# Patient Record
Sex: Male | Born: 1980
Health system: Southern US, Community
[De-identification: ages and names within clinical notes are randomized; demographics above are authoritative.]

## PROBLEM LIST (undated history)

## (undated) DIAGNOSIS — I10 Essential (primary) hypertension: Secondary | ICD-10-CM

## (undated) HISTORY — PX: OTHER SURGICAL HISTORY: SHX169

---

## 2008-11-19 ENCOUNTER — Emergency Department: Payer: Self-pay | Admitting: Unknown Physician Specialty

## 2008-11-30 ENCOUNTER — Emergency Department: Payer: Self-pay | Admitting: Unknown Physician Specialty

## 2014-07-20 ENCOUNTER — Emergency Department: Payer: Self-pay | Admitting: Emergency Medicine

## 2014-07-20 LAB — CBC
HCT: 45.1 % (ref 40.0–52.0)
HGB: 15 g/dL (ref 13.0–18.0)
MCH: 30 pg (ref 26.0–34.0)
MCHC: 33.3 g/dL (ref 32.0–36.0)
MCV: 90 fL (ref 80–100)
Platelet: 197 10*3/uL (ref 150–440)
RBC: 5.01 10*6/uL (ref 4.40–5.90)
RDW: 14.6 % — ABNORMAL HIGH (ref 11.5–14.5)
WBC: 15.5 10*3/uL — ABNORMAL HIGH (ref 3.8–10.6)

## 2014-07-20 LAB — COMPREHENSIVE METABOLIC PANEL
ALT: 62 U/L
Albumin: 3.6 g/dL (ref 3.4–5.0)
Alkaline Phosphatase: 72 U/L
Anion Gap: 8 (ref 7–16)
BUN: 13 mg/dL (ref 7–18)
Bilirubin,Total: 0.4 mg/dL (ref 0.2–1.0)
CO2: 25 mmol/L (ref 21–32)
CREATININE: 0.76 mg/dL (ref 0.60–1.30)
Calcium, Total: 8.1 mg/dL — ABNORMAL LOW (ref 8.5–10.1)
Chloride: 106 mmol/L (ref 98–107)
EGFR (African American): >60
EGFR (Non-African Amer.): >60
Glucose: 94 mg/dL (ref 65–99)
Osmolality: 277 (ref 275–301)
Potassium: 4.5 mmol/L (ref 3.5–5.1)
SGOT(AST): 60 U/L — ABNORMAL HIGH (ref 15–37)
SODIUM: 139 mmol/L (ref 136–145)
TOTAL PROTEIN: 7.7 g/dL (ref 6.4–8.2)

## 2014-07-20 LAB — ETHANOL: Ethanol: <3 mg/dL

## 2015-02-21 ENCOUNTER — Inpatient Hospital Stay
Admission: EM | Admit: 2015-02-21 | Discharge: 2015-02-22 | DRG: 193 | Disposition: A | Discharge status: Home / Self Care | Payer: 59 | Attending: Internal Medicine | Admitting: Internal Medicine

## 2015-02-21 ENCOUNTER — Emergency Department: Payer: 59

## 2015-02-21 DIAGNOSIS — R0602 Shortness of breath: Secondary | ICD-10-CM

## 2015-02-21 DIAGNOSIS — F17200 Nicotine dependence, unspecified, uncomplicated: Secondary | ICD-10-CM | POA: Diagnosis present

## 2015-02-21 DIAGNOSIS — I10 Essential (primary) hypertension: Secondary | ICD-10-CM | POA: Diagnosis present

## 2015-02-21 DIAGNOSIS — R739 Hyperglycemia, unspecified: Secondary | ICD-10-CM | POA: Diagnosis present

## 2015-02-21 DIAGNOSIS — J9601 Acute respiratory failure with hypoxia: Secondary | ICD-10-CM | POA: Diagnosis present

## 2015-02-21 DIAGNOSIS — R0902 Hypoxemia: Secondary | ICD-10-CM

## 2015-02-21 DIAGNOSIS — J189 Pneumonia, unspecified organism: Secondary | ICD-10-CM | POA: Diagnosis present

## 2015-02-21 DIAGNOSIS — J9600 Acute respiratory failure, unspecified whether with hypoxia or hypercapnia: Secondary | ICD-10-CM

## 2015-02-21 LAB — COMPREHENSIVE METABOLIC PANEL
ALK PHOS: 83 U/L (ref 38–126)
ALT: 33 U/L (ref 17–63)
AST: 22 U/L (ref 15–41)
Albumin: 4.2 g/dL (ref 3.5–5.0)
Anion gap: 8 (ref 5–15)
BUN: 16 mg/dL (ref 6–20)
CHLORIDE: 100 mmol/L — AB (ref 101–111)
CO2: 27 mmol/L (ref 22–32)
CREATININE: 0.8 mg/dL (ref 0.61–1.24)
Calcium: 9.2 mg/dL (ref 8.9–10.3)
Glucose, Bld: 144 mg/dL — ABNORMAL HIGH (ref 65–99)
Potassium: 4.1 mmol/L (ref 3.5–5.1)
SODIUM: 135 mmol/L (ref 135–145)
TOTAL PROTEIN: 8.4 g/dL — AB (ref 6.5–8.1)
Total Bilirubin: 0.5 mg/dL (ref 0.3–1.2)

## 2015-02-21 LAB — CBC WITH DIFFERENTIAL/PLATELET
BASOS ABS: 0.2 10*3/uL — AB (ref 0–0.1)
Basophils Relative: 1 %
EOS ABS: 0.1 10*3/uL (ref 0–0.7)
HEMATOCRIT: 47.6 % (ref 40.0–52.0)
HEMOGLOBIN: 15.9 g/dL (ref 13.0–18.0)
Lymphs Abs: 0.8 10*3/uL — ABNORMAL LOW (ref 1.0–3.6)
MCH: 29.1 pg (ref 26.0–34.0)
MCHC: 33.3 g/dL (ref 32.0–36.0)
MCV: 87.5 fL (ref 80.0–100.0)
MONO ABS: 0.6 10*3/uL (ref 0.2–1.0)
Monocytes Relative: 5 %
NEUTROS ABS: 10.7 10*3/uL — AB (ref 1.4–6.5)
Platelets: 167 10*3/uL (ref 150–440)
RBC: 5.44 MIL/uL (ref 4.40–5.90)
RDW: 14.2 % (ref 11.5–14.5)
WBC: 12.3 10*3/uL — ABNORMAL HIGH (ref 3.8–10.6)

## 2015-02-21 LAB — BRAIN NATRIURETIC PEPTIDE: B Natriuretic Peptide: 16 pg/mL (ref 0.0–100.0)

## 2015-02-21 LAB — TROPONIN I: Troponin I: <0.03 ng/mL (ref <0.031)

## 2015-02-21 MED ORDER — PNEUMOCOCCAL VAC POLYVALENT 25 MCG/0.5ML IJ INJ
0.5000 mL | INJECTION | INTRAMUSCULAR | Status: AC
  Administered 2015-02-22: 0.5 mL via INTRAMUSCULAR
  Filled 2015-02-21: qty 0.5

## 2015-02-21 MED ORDER — CEFTRIAXONE SODIUM IN DEXTROSE 20 MG/ML IV SOLN
INTRAVENOUS | Status: AC
  Administered 2015-02-21: 1 g via INTRAVENOUS
  Filled 2015-02-21: qty 50

## 2015-02-21 MED ORDER — HYDROCODONE-ACETAMINOPHEN 5-325 MG PO TABS
1.0000 | ORAL_TABLET | ORAL | Status: DC | PRN
  Administered 2015-02-22 (×2): 1 via ORAL
  Filled 2015-02-21 (×2): qty 1

## 2015-02-21 MED ORDER — CEFTRIAXONE SODIUM IN DEXTROSE 20 MG/ML IV SOLN
1.0000 g | Freq: Once | INTRAVENOUS | Status: AC
  Administered 2015-02-21: 1 g via INTRAVENOUS

## 2015-02-21 MED ORDER — INSULIN ASPART 100 UNIT/ML ~~LOC~~ SOLN
0.0000 [IU] | Freq: Three times a day (TID) | SUBCUTANEOUS | Status: DC
  Administered 2015-02-22: 5 [IU] via SUBCUTANEOUS
  Filled 2015-02-21: qty 5

## 2015-02-21 MED ORDER — IPRATROPIUM-ALBUTEROL 0.5-2.5 (3) MG/3ML IN SOLN
3.0000 mL | Freq: Four times a day (QID) | RESPIRATORY_TRACT | Status: DC
  Administered 2015-02-21 – 2015-02-22 (×2): 3 mL via RESPIRATORY_TRACT
  Filled 2015-02-21 (×3): qty 3

## 2015-02-21 MED ORDER — IPRATROPIUM-ALBUTEROL 0.5-2.5 (3) MG/3ML IN SOLN
3.0000 mL | Freq: Once | RESPIRATORY_TRACT | Status: AC
  Administered 2015-02-21: 3 mL via RESPIRATORY_TRACT

## 2015-02-21 MED ORDER — METHYLPREDNISOLONE SODIUM SUCC 125 MG IJ SOLR
60.0000 mg | Freq: Four times a day (QID) | INTRAMUSCULAR | Status: DC
  Administered 2015-02-21 – 2015-02-22 (×4): 60 mg via INTRAVENOUS
  Filled 2015-02-21 (×2): qty 2

## 2015-02-21 MED ORDER — DEXTROSE 5 % IV SOLN
INTRAVENOUS | Status: AC
  Administered 2015-02-21: 500 mg via INTRAVENOUS
  Filled 2015-02-21: qty 500

## 2015-02-21 MED ORDER — IPRATROPIUM-ALBUTEROL 0.5-2.5 (3) MG/3ML IN SOLN
RESPIRATORY_TRACT | Status: AC
  Administered 2015-02-21: 3 mL via RESPIRATORY_TRACT
  Filled 2015-02-21: qty 3

## 2015-02-21 MED ORDER — SODIUM CHLORIDE 0.9 % IV SOLN
INTRAVENOUS | Status: DC
  Administered 2015-02-21 – 2015-02-22 (×2): via INTRAVENOUS

## 2015-02-21 MED ORDER — METOPROLOL TARTRATE 25 MG PO TABS
25.0000 mg | ORAL_TABLET | Freq: Two times a day (BID) | ORAL | Status: DC
  Administered 2015-02-22: 25 mg via ORAL
  Filled 2015-02-21: qty 1

## 2015-02-21 MED ORDER — ONDANSETRON HCL 4 MG PO TABS: 4.0000 mg | ORAL_TABLET | Freq: Four times a day (QID) | ORAL | Status: DC | PRN

## 2015-02-21 MED ORDER — CEFTRIAXONE SODIUM IN DEXTROSE 20 MG/ML IV SOLN
1.0000 g | Freq: Two times a day (BID) | INTRAVENOUS | Status: DC
  Administered 2015-02-22: 1 g via INTRAVENOUS
  Filled 2015-02-21 (×3): qty 50

## 2015-02-21 MED ORDER — METHYLPREDNISOLONE SODIUM SUCC 125 MG IJ SOLR
INTRAMUSCULAR | Status: AC
  Administered 2015-02-22: 60 mg via INTRAVENOUS
  Filled 2015-02-21: qty 2

## 2015-02-21 MED ORDER — IPRATROPIUM-ALBUTEROL 0.5-2.5 (3) MG/3ML IN SOLN
RESPIRATORY_TRACT | Status: AC
  Filled 2015-02-21: qty 3

## 2015-02-21 MED ORDER — ONDANSETRON HCL 4 MG/2ML IJ SOLN: 4.0000 mg | Freq: Four times a day (QID) | INTRAMUSCULAR | Status: DC | PRN

## 2015-02-21 MED ORDER — IPRATROPIUM-ALBUTEROL 0.5-2.5 (3) MG/3ML IN SOLN
RESPIRATORY_TRACT | Status: AC
Start: 2015-02-21 — End: 2015-02-22
  Filled 2015-02-21: qty 3

## 2015-02-21 MED ORDER — POLYETHYLENE GLYCOL 3350 17 G PO PACK: 17.0000 g | PACK | Freq: Every day | ORAL | Status: DC | PRN

## 2015-02-21 MED ORDER — DEXAMETHASONE SODIUM PHOSPHATE 10 MG/ML IJ SOLN
10.0000 mg | Freq: Once | INTRAMUSCULAR | Status: AC
  Administered 2015-02-21: 10 mg via INTRAMUSCULAR

## 2015-02-21 MED ORDER — DEXTROSE 5 % IV SOLN
500.0000 mg | INTRAVENOUS | Status: DC
  Administered 2015-02-22: 500 mg via INTRAVENOUS
  Filled 2015-02-21 (×2): qty 500

## 2015-02-21 MED ORDER — HEPARIN SODIUM (PORCINE) 5000 UNIT/ML IJ SOLN
5000.0000 [IU] | Freq: Three times a day (TID) | INTRAMUSCULAR | Status: DC
  Administered 2015-02-22 (×2): 5000 [IU] via SUBCUTANEOUS
  Filled 2015-02-21 (×2): qty 1

## 2015-02-21 MED ORDER — DEXAMETHASONE SODIUM PHOSPHATE 10 MG/ML IJ SOLN
INTRAMUSCULAR | Status: AC
Start: 2015-02-21 — End: 2015-02-21
  Administered 2015-02-21: 10 mg via INTRAMUSCULAR
  Filled 2015-02-21: qty 1

## 2015-02-21 MED ORDER — DEXTROSE 5 % IV SOLN
500.0000 mg | Freq: Once | INTRAVENOUS | Status: AC
  Administered 2015-02-21: 500 mg via INTRAVENOUS

## 2015-02-21 NOTE — ED Provider Notes (Signed)
-----------------------------------------   5:31 PM on 02/21/2015 -----------------------------------------  I have personally seen and examined the patient. Patient is a 34 year old male with no known past medical history who presents the emergency department with 2 days of cough and congestion, much worse at night now short of breath. Shortness of breath worse with exertion. Patient has been coughing with a green/yellow sputum production. He states he smokes approximately one pack per day of cigarettes, but does not typically get short of breath with exertion. Patient denies any chest pain, pressure, but does state tightness and shortness of breath. He denies any fever but states he has been getting sweaty. Physical exam is consistent for tachycardia around 110 and 120 bpm. Also with bilateral rhonchi, which does not clear with coughing. Patient also desats to 86-87% on room air. Patient denies any recent chest pain. Given his physical exam findings and history, the chest x-ray is likely most compatible with an atypical pneumonia causing his cough/congestion and hypoxia. Technically meeting sepsis criteria, along with desaturations blood cultures were taken, we'll start the patient IV antibiotics and admitted the patient to the hospital for further workup and evaluation.  Minna Antis, MD 02/21/15 1733

## 2015-02-21 NOTE — ED Notes (Signed)
Report to Megan, RN.

## 2015-02-21 NOTE — H&P (Signed)
History and Physical    Ronald Arias NWG:[id number] DOB: 01/13/1981 DOA: 02/21/2015  Referring physician: Dr. Lenard Lance PCP: No PCP Per Patient  Specialists: none  Chief Complaint: SOB and cough  HPI: Ronald Arias is a 34 y.o. male has a past medical history significant for tobacco abuse who presents to ER with worsening SOB and cough. In ER, pt hypoxic with ambulation. CXR shows atypical PNA. Now admitted for further evaluation and treatment. Also noted to be hyperglycemic and hypertensive.  Review of Systems: The patient denies anorexia, fever, weight loss,, vision loss, decreased hearing, hoarseness, chest pain, syncope, peripheral edema, balance deficits, hemoptysis, abdominal pain, melena, hematochezia, severe indigestion/heartburn, hematuria, incontinence, genital sores, muscle weakness, suspicious skin lesions, transient blindness, difficulty walking, depression, unusual weight change, abnormal bleeding, enlarged lymph nodes, angioedema, and breast masses.   History reviewed. No pertinent past medical history. History reviewed. No pertinent past surgical history. Social History:  reports that he has been smoking.  He does not have any smokeless tobacco history on file. He reports that he does not drink alcohol. His drug history is not on file.  No Known Allergies  History reviewed. No pertinent family history.  Prior to Admission medications   Not on File   Physical Exam: Filed Vitals:   02/21/15 1352 02/21/15 1630 02/21/15 1700 02/21/15 1800  BP:  149/81 153/80 133/105  Pulse: 107  109 106  Temp:      TempSrc:      Resp:   24 20  Height:      Weight:      SpO2: 95%  88% 90%     General:  No apparent distress  Eyes: PERRL, EOMI, no scleral icterus  ENT: moist oropharynx  Neck: supple, no lymphadenopathy  Cardiovascular: rapid rate, regular rhythm without MRG; 2+ peripheral pulses, no JVD, no peripheral edema  Respiratory: diffuse wheezes and  rhonchi noted bilaterally  Abdomen: soft, non tender to palpation, positive bowel sounds, no guarding, no rebound  Skin: no rashes  Musculoskeletal: normal bulk and tone, no joint swelling  Psychiatric: normal mood and affect  Neurologic: CN 2-12 grossly intact, MS 5/5 in all 4  Labs on Admission:  Basic Metabolic Panel:  Recent Labs Lab 02/21/15 1555  NA 135  K 4.1  CL 100*  CO2 27  GLUCOSE 144*  BUN 16  CREATININE 0.80  CALCIUM 9.2   Liver Function Tests:  Recent Labs Lab 02/21/15 1555  AST 22  ALT 33  ALKPHOS 83  BILITOT 0.5  PROT 8.4*  ALBUMIN 4.2   No results for input(s): LIPASE, AMYLASE in the last 168 hours. No results for input(s): AMMONIA in the last 168 hours. CBC:  Recent Labs Lab 02/21/15 1555  WBC 12.3*  NEUTROABS 10.7*  HGB 15.9  HCT 47.6  MCV 87.5  PLT 167   Cardiac Enzymes:  Recent Labs Lab 02/21/15 1555  TROPONINI <0.03    BNP (last 3 results)  Recent Labs  02/21/15 1555  BNP 16.0    ProBNP (last 3 results) No results for input(s): PROBNP in the last 8760 hours.  CBG: No results for input(s): GLUCAP in the last 168 hours.  Radiological Exams on Admission: Dg Chest 2 View (if Patient Has Fever And/or Copd)  02/21/2015   CLINICAL DATA:  Cough, shortness of breath  EXAM: CHEST  2 VIEW  COMPARISON:  None.  FINDINGS: Bilateral diffuse mild interstitial thickening. Small bilateral pleural effusions, right greater than left. No pneumothorax. Normal cardiomediastinal silhouette. No  acute osseous abnormality.  IMPRESSION: Bilateral, diffuse interstitial thickening and small bilateral pleural effusions. Differential considerations include mild pulmonary edema versus interstitial infection.   Electronically Signed   By: Elige Ko   On: 02/21/2015 15:17    EKG: Independently reviewed.  Assessment/Plan Principal Problem:   Acute respiratory failure Active Problems:   Atypical pneumonia   Hyperglycemia   HTN  (hypertension)  Will admit to floor with IV fluids, IV steroids, IV ABX, and SVN's. Follow O2 closely. Follow sugars and BP. Repeat labs in AM.  Diet: 1800 cal ADA, 2g Na+ Fluids: NS@100  DVT Prophylaxis: SQ Heparin  Code Status: FULL  Family Communication: none  Disposition Plan: home  Time spent: 50 min

## 2015-02-21 NOTE — ED Provider Notes (Signed)
[facility] Emergency Department Provider Note  ____________________________________________  Time seen: Approximately 1445  I have reviewed the triage vital signs and the nursing notes.   HISTORY  Chief Complaint Cough and URI    HPI Ronald Arias is a 34 y.o. male complaining of a worsening cough congestion shortness of breath over the last 3-4 days states that over the last 2 days he has had a productive green cough denies fever or chills states he has no history of asthma or any medical issues of note states that he's been increasingly shortness of breath with exertion can't seem to get his chest clear its overall as about 8 out of 10 in discomfort nothing seemingly making it better and denies any other symptoms of note is denied any lower extremity edema fevers chills states that he is about a 1 pack per day smoker   History reviewed. No pertinent past medical history.  There are no active problems to display for this patient.   History reviewed. No pertinent past surgical history.  No current outpatient prescriptions on file.  Allergies Review of patient's allergies indicates no known allergies.  No family history on file.  Social History History  Substance Use Topics  . Smoking status: Current Every Day Smoker  . Smokeless tobacco: Not on file  . Alcohol Use: No    Review of Systems Constitutional: No fever/chills Eyes: No visual changes. ENT: No sore throat. Cardiovascular: Denies chest pain. Respiratory: Denies shortness of breath. Gastrointestinal: No abdominal pain.  No nausea, no vomiting.  No diarrhea.  No constipation. Genitourinary: Negative for dysuria. Musculoskeletal: Negative for back pain. Skin: Negative for rash. Neurological: Negative for headaches, focal weakness or numbness.  10-point ROS otherwise negative.  ____________________________________________   PHYSICAL EXAM:  VITAL SIGNS: ED Triage Vitals   Enc Vitals Group     BP 02/21/15 1333 178/80 mmHg     Pulse Rate 02/21/15 1333 110     Resp 02/21/15 1333 20     Temp 02/21/15 1333 98.6 F (37 C)     Temp Source 02/21/15 1333 Oral     SpO2 02/21/15 1333 89 %     Weight 02/21/15 1333 260 lb (117.935 kg)     Height 02/21/15 1333  (1.803 m)     Head Cir --      Peak Flow --      Pain Score 02/21/15 1334 0     Pain Loc --      Pain Edu? --      Excl. in GC? --     Constitutional: Alert and oriented. Well appearing and in no acute distress. Eyes: Conjunctivae are normal. PERRL. EOMI. Head: Atraumatic. Nose: No congestion/rhinnorhea. Mouth/Throat: Mucous membranes are moist.  Oropharynx non-erythematous. Neck: No stridor.   Hematological/Lymphatic/Immunilogical: No cervical lymphadenopathy. Cardiovascular: Normal rate, regular rhythm. Grossly normal heart sounds.  Good peripheral circulation. Respiratory: Diminished breath sounds in all fields wheezes and rhonchi bilaterally Gastrointestinal: Soft and nontender. No distention. No abdominal bruits. No CVA tenderness. Musculoskeletal: No lower extremity tenderness nor edema.  No joint effusions. Neurologic:  Normal speech and language. No gross focal neurologic deficits are appreciated. Speech is normal. No gait instability. Skin:  Skin is warm, dry and intact. No rash noted. Psychiatric: Mood and affect are normal. Speech and behavior are normal.  ____________________________________________   LABS pending (all labs ordered are listed, but only abnormal results are displayed)  Labs Reviewed  CULTURE, BLOOD (ROUTINE X 2)  CULTURE, BLOOD (  ROUTINE X 2)  CBC WITH DIFFERENTIAL/PLATELET  BRAIN NATRIURETIC PEPTIDE  TROPONIN I  COMPREHENSIVE METABOLIC PANEL   ____________________________________________  EKG  Pending ____________________________________________  RADIOLOGY  Chest x-ray IMPRESSION: Bilateral, diffuse interstitial thickening and small  bilateral pleural effusions. Differential considerations include mild pulmonary edema versus interstitial infection.   Electronically Signed By: Elige Ko On: 02/21/2015 15:17       ____________________________________________   PROCEDURES  Procedure(s) performed: None  Critical Care performed:   ____________________________________________   INITIAL IMPRESSION / ASSESSMENT AND PLAN / ED COURSE  Pertinent labs & imaging results that were available during my care of the patient were reviewed by me and considered in my medical decision making (see chart for details).  Initial impression on this patient has dyspnea he has received 3 nebulizer treatments in the department as well as a shot of dexamethasone chest x-ray showed him to have bilateral diffuse interstitial thickening with bilateral pleural effusions right greater than left concerning for either pulmonary edema or infection further workup has been ordered patient was moved over to the care of Dr . Lenard Lance please refer to his note for final diagnosis and further information ____________________________________________   FINAL CLINICAL IMPRESSION(S) / ED DIAGNOSES  Final diagnoses:  None     Ashna Dorough Rosalyn Gess, PA-C 02/21/15 1638  Minna Antis, MD 02/21/15 2117

## 2015-02-21 NOTE — ED Notes (Signed)
Pt states that his breathing is better now after duoneb

## 2015-02-21 NOTE — ED Notes (Addendum)
Cough, chest congestion, green production X 2 days. Pt alert and oriented X4, active, cooperative, pt in NAD. RR even and unlabored, color WNL.  Pt states his work requested that he come here to get checked out.

## 2015-02-22 ENCOUNTER — Inpatient Hospital Stay: Payer: 59

## 2015-02-22 ENCOUNTER — Encounter: Payer: Self-pay | Admitting: Internal Medicine

## 2015-02-22 LAB — COMPREHENSIVE METABOLIC PANEL
ALT: 30 U/L (ref 17–63)
AST: 23 U/L (ref 15–41)
Albumin: 3.8 g/dL (ref 3.5–5.0)
Alkaline Phosphatase: 72 U/L (ref 38–126)
Anion gap: 6 (ref 5–15)
BUN: 13 mg/dL (ref 6–20)
CO2: 24 mmol/L (ref 22–32)
CREATININE: 0.85 mg/dL (ref 0.61–1.24)
Calcium: 8.7 mg/dL — ABNORMAL LOW (ref 8.9–10.3)
Chloride: 104 mmol/L (ref 101–111)
GFR calc non Af Amer: >60 mL/min (ref >60)
GLUCOSE: 221 mg/dL — AB (ref 65–99)
Potassium: 4.3 mmol/L (ref 3.5–5.1)
Sodium: 134 mmol/L — ABNORMAL LOW (ref 135–145)
TOTAL PROTEIN: 7.5 g/dL (ref 6.5–8.1)
Total Bilirubin: 0.4 mg/dL (ref 0.3–1.2)

## 2015-02-22 LAB — CBC
HCT: 45.9 % (ref 40.0–52.0)
Hemoglobin: 15.3 g/dL (ref 13.0–18.0)
MCH: 29.4 pg (ref 26.0–34.0)
MCHC: 33.2 g/dL (ref 32.0–36.0)
MCV: 88.4 fL (ref 80.0–100.0)
Platelets: 204 10*3/uL (ref 150–440)
RBC: 5.19 MIL/uL (ref 4.40–5.90)
RDW: 14.3 % (ref 11.5–14.5)
WBC: 10.8 10*3/uL — ABNORMAL HIGH (ref 3.8–10.6)

## 2015-02-22 LAB — HEMOGLOBIN A1C: Hgb A1c MFr Bld: 6 % (ref 4.0–6.0)

## 2015-02-22 LAB — GLUCOSE, CAPILLARY
Glucose-Capillary: 238 mg/dL — ABNORMAL HIGH (ref 65–99)
Glucose-Capillary: 268 mg/dL — ABNORMAL HIGH (ref 65–99)

## 2015-02-22 MED ORDER — METOPROLOL TARTRATE 50 MG PO TABS: 50.0000 mg | ORAL_TABLET | Freq: Two times a day (BID) | ORAL | Status: DC

## 2015-02-22 MED ORDER — METOPROLOL TARTRATE 50 MG PO TABS
50.0000 mg | ORAL_TABLET | Freq: Two times a day (BID) | ORAL | Status: DC
  Administered 2015-02-22: 50 mg via ORAL
  Filled 2015-02-22: qty 1

## 2015-02-22 MED ORDER — ACCU-CHEK MULTICLIX LANCETS MISC: Status: DC

## 2015-02-22 MED ORDER — LEVOFLOXACIN 750 MG PO TABS: 750.0000 mg | ORAL_TABLET | Freq: Every day | ORAL | Status: DC

## 2015-02-22 MED ORDER — METHYLPREDNISOLONE 4 MG PO TABS: 4.0000 mg | ORAL_TABLET | Freq: Every day | ORAL | Status: DC

## 2015-02-22 NOTE — Progress Notes (Signed)
Ronald Arias was admitted to the Hospital on 02/21/2015 and Discharged  02/22/2015 and should be excused from work/school   for 2  days starting 02/21/2015 , may return to work/school without any restrictions.  Call Ronald Saran MD with questions.  Ronald Arias M.D on 02/22/2015,at 10:54 AM  [facility] - [place] at [facility]  [phone]

## 2015-02-22 NOTE — Discharge Summary (Signed)
[facility] - [place] at [facility]   PATIENT NAME: Ronald Arias    MR#:  [id number]  DATE OF BIRTH:  05-12-1981  DATE OF ADMISSION:  02/21/2015 ADMITTING PHYSICIAN: Marguarite Arbour, MD  DATE OF DISCHARGE: 02/22/2015  PRIMARY CARE PHYSICIAN: No PCP Per Patient    ADMISSION DIAGNOSIS:  Community acquired pneumonia [J18.9] Hypoxia [R09.02]  DISCHARGE DIAGNOSIS:  Principal Problem:   Acute respiratory failure Active Problems:   Atypical pneumonia   Hyperglycemia   HTN (hypertension)   SECONDARY DIAGNOSIS:  History reviewed. No pertinent past medical history.  HOSPITAL COURSE:  This is a 34 year old male with no past medical history who presented with shortness of breath and found to have a community acquired pneumonia. For further details please further H&P.  1. Community acquired pneumonia with hypoxia: Patient was initially placed on oxygen. Patient is no longer hypoxic. Patient was started on azithromycin and Rocephin for his pneumonia. Patient will be discharged on Levaquin. Due to some wheezing on admission he was started on IV Steris. He did not have wheezing on discharge. He will be discharged with a quick steroid taper.  2. Hyperglycemia: I have ordered a hemoglobin A1c but this is still pending. Patient was referred to diabetes outpatient. Patient will need close monitoring for his blood sugars. I also discharge him with lancets and a glucometer. The nurse to teach him how to check his blood sugars prior to discharge. He will need to follow-up with the primary care physician which I did discuss with him.  3. Elevated blood pressure with no formal diagnosis of hypertension: Patient's blood pressure was elevated here in the hospital. He was started on metoprolol 25 by mouth twice a day. He will need close follow-up and monitoring of blood pressure. I also discussed this with the patient.   DISCHARGE CONDITIONS AND DIET:  Patient will be  discharged home with an ADA diet and heart healthy diet  CONSULTS OBTAINED:    None DRUG ALLERGIES:  No Known Allergies  DISCHARGE MEDICATIONS:   Current Discharge Medication List    START taking these medications   Details  Lancets (ACCU-CHEK MULTICLIX) lancets Use as instructed Qty: 100 each, Refills: 12    levofloxacin (LEVAQUIN) 750 MG tablet Take 1 tablet (750 mg total) by mouth daily. Qty: 5 tablet, Refills: 0    methylPREDNISolone (MEDROL) 4 MG tablet Take 1 tablet (4 mg total) by mouth daily. Qty: 20 tablet, Refills: 0    metoprolol (LOPRESSOR) 50 MG tablet Take 1 tablet (50 mg total) by mouth 2 (two) times daily. Qty: 60 tablet, Refills: 0              Today   CHIEF COMPLAINT:  Patient voices no compressive day. He is ready to be discharged home. He is no longer hypoxic.   VITAL SIGNS:  Blood pressure 137/66, pulse 127, temperature 98 F (36.7 C), temperature source Oral, resp. rate 17, height 5\' 11"  (1.803 m), weight 117.935 kg (260 lb), SpO2 98 %.   REVIEW OF SYSTEMS:  Review of Systems  Constitutional: Negative for fever and chills.  Eyes: Negative for blurred vision and double vision.  Respiratory: Positive for cough. Negative for sputum production, shortness of breath and wheezing.   Gastrointestinal: Negative for vomiting, abdominal pain and diarrhea.  Neurological: Negative for headaches.     PHYSICAL EXAMINATION:  GENERAL:  34 y.o.-year-old patient lying in the bed with no acute distress.  NECK:  Supple, no jugular venous distention. No  thyroid enlargement, no tenderness.  LUNGS: Normal breath sounds bilaterally, no wheezing, rales,rhonchi  No use of accessory muscles of respiration.  CARDIOVASCULAR: Tachycardia. No murmurs, rubs, or gallops.  ABDOMEN: Soft, non-tender, non-distended. Bowel sounds present. No organomegaly or mass.  EXTREMITIES: No pedal edema, cyanosis, or clubbing.  PSYCHIATRIC: The patient is alert and oriented x 3.   SKIN: No obvious rash, lesion, or ulcer.   DATA REVIEW:   CBC  Recent Labs Lab 02/22/15 0427  WBC 10.8*  HGB 15.3  HCT 45.9  PLT 204    Chemistries   Recent Labs Lab 02/22/15 0427  NA 134*  K 4.3  CL 104  CO2 24  GLUCOSE 221*  BUN 13  CREATININE 0.85  CALCIUM 8.7*  AST 23  ALT 30  ALKPHOS 72  BILITOT 0.4    Cardiac Enzymes  Recent Labs Lab 02/21/15 1555  TROPONINI <0.03    Microbiology Results  @  RADIOLOGY:  X-ray Chest Pa And Lateral  02/22/2015     IMPRESSION: Stable chest x-ray with patchy airspace opacity in the right mid lung and small right probably parapneumonic effusion.  Trace left pleural effusion.   Electronically Signed   By: Malachy Moan M.D.   On: 02/22/2015 09:04   Dg Chest 2 View (if Patient Has Fever And/or Copd)  02/21/2015     IMPRESSION: Bilateral, diffuse interstitial thickening and small bilateral pleural effusions. Differential considerations include mild pulmonary edema versus interstitial infection.   Electronically Signed   By: Elige Ko   On: 02/21/2015 15:17      Management plans discussed with the patient and he is in agreement. Stable for discharge home   Patient should follow up with PCP in 1 week  CODE STATUS:     Code Status Orders        Start     Ordered   02/21/15 2007  Full code   Continuous     02/21/15 2006      TOTAL TIME TAKING CARE OF THIS PATIENT: 36 minutes.    Dareion Kneece M.D on 02/22/2015 at 10:42 AM  Between 7am to 6pm - Pager - [phone] After 6pm go to [email or web address] - password [email or web address] [facility]  [facility] [place] [facility]  Office  [phone]  CC: Primary care physician; No PCP Per Patient

## 2015-02-26 LAB — CULTURE, BLOOD (ROUTINE X 2)

## 2015-03-04 ENCOUNTER — Encounter: Payer: Self-pay | Admitting: Family Medicine

## 2015-03-04 ENCOUNTER — Ambulatory Visit (INDEPENDENT_AMBULATORY_CARE_PROVIDER_SITE_OTHER): Payer: 59 | Admitting: Family Medicine

## 2015-03-04 VITALS — BP 122/86 | HR 75 | Ht 71.0 in | Wt 269.0 lb

## 2015-03-04 DIAGNOSIS — E669 Obesity, unspecified: Secondary | ICD-10-CM | POA: Diagnosis not present

## 2015-03-04 DIAGNOSIS — F17200 Nicotine dependence, unspecified, uncomplicated: Secondary | ICD-10-CM | POA: Insufficient documentation

## 2015-03-04 DIAGNOSIS — Z8379 Family history of other diseases of the digestive system: Secondary | ICD-10-CM

## 2015-03-04 DIAGNOSIS — I10 Essential (primary) hypertension: Secondary | ICD-10-CM

## 2015-03-04 DIAGNOSIS — F1110 Opioid abuse, uncomplicated: Secondary | ICD-10-CM | POA: Diagnosis not present

## 2015-03-04 DIAGNOSIS — F1111 Opioid abuse, in remission: Secondary | ICD-10-CM

## 2015-03-04 DIAGNOSIS — Z720 Tobacco use: Secondary | ICD-10-CM

## 2015-03-04 DIAGNOSIS — E559 Vitamin D deficiency, unspecified: Secondary | ICD-10-CM | POA: Diagnosis not present

## 2015-03-04 DIAGNOSIS — Z833 Family history of diabetes mellitus: Secondary | ICD-10-CM | POA: Diagnosis not present

## 2015-03-04 DIAGNOSIS — R7309 Other abnormal glucose: Secondary | ICD-10-CM | POA: Diagnosis not present

## 2015-03-04 DIAGNOSIS — J189 Pneumonia, unspecified organism: Secondary | ICD-10-CM | POA: Diagnosis not present

## 2015-03-04 DIAGNOSIS — R7303 Prediabetes: Secondary | ICD-10-CM

## 2015-03-04 NOTE — Progress Notes (Signed)
Date:  03/04/2015   Name:  Ronald Arias   DOB:  1981/08/18   MRN:  [id number]  PCP:  No PCP Per Patient    Chief Complaint: transition into care   History of Present Illness:  This is a 34 y.o. male seen [facility] ED 2 wks ago dx'd with atypical pneumonia placed on Levaquin and steroids, sxs resolved except mild cough. Hyperglycemic in ED and a1c showed prediabetes. Also placed on metoprolol for elevated BP/pulse but only took two days as made him lightheaded. Smoker 1/2 ppd down from 2 ppd, wants to quit. Hx opioid abuse yrs ago. Weight stable, no regular exercise, no current diet.  Review of Systems:  Review of Systems  Constitutional: Negative for appetite change and unexpected weight change.  HENT: Negative for sore throat.   Eyes: Negative for pain.  Respiratory: Negative for shortness of breath.   Cardiovascular: Positive for leg swelling. Negative for chest pain.  Gastrointestinal: Negative for abdominal pain and diarrhea.  Endocrine: Negative for polyuria.  Genitourinary: Negative for difficulty urinating.  Skin: Negative for rash.  Neurological: Negative for tremors and syncope.  Hematological: Negative for adenopathy.  Psychiatric/Behavioral: Negative for sleep disturbance.    Patient Active Problem List   Diagnosis Date Noted  . Obesity (BMI 30-39.9) 03/04/2015  . Smoker 03/04/2015  . Prediabetes 03/04/2015  . Hx of opioid abuse 03/04/2015  . FH: diabetes mellitus 03/04/2015  . FH: Crohn's disease 03/04/2015    Prior to Admission medications   Not on File    No Known Allergies  No past surgical history on file.  History  Substance Use Topics  . Smoking status: Current Every Day Smoker  . Smokeless tobacco: Not on file  . Alcohol Use: No    No family history on file.  Medication list has been reviewed and updated.  Physical Examination: BP 122/86 mmHg  Pulse 75  Ht  (1.803 m)  Wt 269 lb (122.018 kg)  BMI 37.53 kg/m2  Physical Exam   Constitutional: He is oriented to person, place, and time. He appears well-developed and well-nourished. No distress.  HENT:  Head: Normocephalic and atraumatic.  Nose: Nose normal.  Mouth/Throat: Oropharynx is clear and moist.  Eyes: Conjunctivae and EOM are normal. Pupils are equal, round, and reactive to light. No scleral icterus.  Neck: Neck supple. No thyromegaly present.  Cardiovascular: Normal rate, regular rhythm and normal heart sounds.  Exam reveals no gallop.   No murmur heard. Pulmonary/Chest: Effort normal and breath sounds normal. He has no wheezes. He has no rales.  Abdominal: Soft. He exhibits no distension and no mass. There is no tenderness.  Musculoskeletal: He exhibits no edema.  Lymphadenopathy:    He has no cervical adenopathy.  Neurological: He is alert and oriented to person, place, and time. Coordination normal.  Skin: Skin is warm and dry. No rash noted.  Psychiatric: He has a normal mood and affect. His behavior is normal.    Assessment and Plan:  1. Smoker Discussed cessation strategies, may consider vaping to start  2. Obesity (BMI 30-39.9) Discussed weight loss strategies including increased exercise - TSH - Lipid Profile - Vitamin D (25 hydroxy) - Comprehensive metabolic panel  3. Prediabetes A1c 6.0%, discussed exercise, weight loss, NCS diet  4. Atypical pneumonia Resolved except mild cough  5. Essential hypertension Intolerant metoprolol and BP ok off meds today, will monitor  6. Hx of opioid abuse  7. FH: diabetes mellitus  8. FH: Crohn's disease  Return  in about 4 weeks (around 04/01/2015).  Dionne Ano. Kingsley Spittle MD [facility]  03/04/2015

## 2015-03-05 DIAGNOSIS — E559 Vitamin D deficiency, unspecified: Secondary | ICD-10-CM | POA: Insufficient documentation

## 2015-03-05 LAB — COMPREHENSIVE METABOLIC PANEL
ALK PHOS: 89 IU/L (ref 39–117)
ALT: 61 IU/L — ABNORMAL HIGH (ref 0–44)
AST: 29 IU/L (ref 0–40)
Albumin/Globulin Ratio: 1.3 (ref 1.1–2.5)
Albumin: 4 g/dL (ref 3.5–5.5)
BUN/Creatinine Ratio: 20 — ABNORMAL HIGH (ref 8–19)
BUN: 15 mg/dL (ref 6–20)
Bilirubin Total: 0.3 mg/dL (ref 0.0–1.2)
CHLORIDE: 98 mmol/L (ref 97–108)
CO2: 23 mmol/L (ref 18–29)
Calcium: 9.2 mg/dL (ref 8.7–10.2)
Creatinine, Ser: 0.75 mg/dL — ABNORMAL LOW (ref 0.76–1.27)
GFR calc Af Amer: 139 mL/min/{1.73_m2} (ref >59)
GFR, EST NON AFRICAN AMERICAN: 121 mL/min/{1.73_m2} (ref >59)
Globulin, Total: 3.1 g/dL (ref 1.5–4.5)
Glucose: 117 mg/dL — ABNORMAL HIGH (ref 65–99)
Potassium: 4.4 mmol/L (ref 3.5–5.2)
SODIUM: 140 mmol/L (ref 134–144)
Total Protein: 7.1 g/dL (ref 6.0–8.5)

## 2015-03-05 LAB — LIPID PANEL
Chol/HDL Ratio: 3.5 ratio units (ref 0.0–5.0)
Cholesterol, Total: 209 mg/dL — ABNORMAL HIGH (ref 100–199)
HDL: 60 mg/dL (ref >39)
LDL Calculated: 133 mg/dL — ABNORMAL HIGH (ref 0–99)
TRIGLYCERIDES: 78 mg/dL (ref 0–149)
VLDL Cholesterol Cal: 16 mg/dL (ref 5–40)

## 2015-03-05 LAB — VITAMIN D 25 HYDROXY (VIT D DEFICIENCY, FRACTURES): VIT D 25 HYDROXY: 6.8 ng/mL — AB (ref 30.0–100.0)

## 2015-03-05 LAB — TSH: TSH: 1.69 u[IU]/mL (ref 0.450–4.500)

## 2015-03-05 MED ORDER — VITAMIN D3 125 MCG (5000 UT) PO CAPS: 1.0000 | ORAL_CAPSULE | Freq: Every day | ORAL | Status: DC

## 2015-03-05 NOTE — Addendum Note (Signed)
Addended by: Schuyler Amor on: 03/05/2015 09:14 AM   Modules accepted: Orders

## 2015-04-03 ENCOUNTER — Ambulatory Visit: Payer: 59 | Admitting: Family Medicine

## 2016-03-03 ENCOUNTER — Ambulatory Visit
Admission: EM | Admit: 2016-03-03 | Discharge: 2016-03-03 | Disposition: A | Discharge status: Home / Self Care | Payer: BLUE CROSS/BLUE SHIELD | Attending: Family Medicine | Admitting: Family Medicine

## 2016-03-03 ENCOUNTER — Encounter: Payer: Self-pay | Admitting: Emergency Medicine

## 2016-03-03 DIAGNOSIS — L259 Unspecified contact dermatitis, unspecified cause: Secondary | ICD-10-CM

## 2016-03-03 MED ORDER — TRIAMCINOLONE ACETONIDE 0.1 % EX CREA: 1.0000 "application " | TOPICAL_CREAM | Freq: Two times a day (BID) | CUTANEOUS | Status: DC

## 2016-03-03 MED ORDER — MUPIROCIN 2 % EX OINT: 1.0000 "application " | TOPICAL_OINTMENT | Freq: Three times a day (TID) | CUTANEOUS | Status: DC

## 2016-03-03 MED ORDER — METHYLPREDNISOLONE 4 MG PO TBPK: ORAL_TABLET | ORAL | Status: DC

## 2016-03-03 NOTE — Discharge Instructions (Signed)
Contact Dermatitis Dermatitis is redness, soreness, and swelling (inflammation) of the skin. Contact dermatitis is a reaction to certain substances that touch the skin. There are two types of contact dermatitis:   Irritant contact dermatitis. This type is caused by something that irritates your skin, such as dry hands from washing them too much. This type does not require previous exposure to the substance for a reaction to occur. This type is more common.  Allergic contact dermatitis. This type is caused by a substance that you are allergic to, such as a nickel allergy or poison ivy. This type only occurs if you have been exposed to the substance (allergen) before. Upon a repeat exposure, your body reacts to the substance. This type is less common. CAUSES  Many different substances can cause contact dermatitis. Irritant contact dermatitis is most commonly caused by exposure to:   Makeup.   Soaps.   Detergents.   Bleaches.   Acids.   Metal salts, such as nickel.  Allergic contact dermatitis is most commonly caused by exposure to:   Poisonous plants.   Chemicals.   Jewelry.   Latex.   Medicines.   Preservatives in products, such as clothing.  RISK FACTORS This condition is more likely to develop in:   People who have jobs that expose them to irritants or allergens.  People who have certain medical conditions, such as asthma or eczema.  SYMPTOMS  Symptoms of this condition may occur anywhere on your body where the irritant has touched you or is touched by you. Symptoms include:  Dryness or flaking.   Redness.   Cracks.   Itching.   Pain or a burning feeling.   Blisters.  Drainage of small amounts of blood or clear fluid from skin cracks. With allergic contact dermatitis, there may also be swelling in areas such as the eyelids, mouth, or genitals.  DIAGNOSIS  This condition is diagnosed with a medical history and physical exam. A patch skin test  may be performed to help determine the cause. If the condition is related to your job, you may need to see an occupational medicine specialist. TREATMENT Treatment for this condition includes figuring out what caused the reaction and protecting your skin from further contact. Treatment may also include:   Steroid creams or ointments. Oral steroid medicines may be needed in more severe cases.  Antibiotics or antibacterial ointments, if a skin infection is present.  Antihistamine lotion or an antihistamine taken by mouth to ease itching.  A bandage (dressing). HOME CARE INSTRUCTIONS Skin Care  Moisturize your skin as needed.   Apply cool compresses to the affected areas.  Try taking a bath with:  Epsom salts. Follow the instructions on the packaging. You can get these at your local pharmacy or grocery store.  Baking soda. Pour a small amount into the bath as directed by your health care provider.  Colloidal oatmeal. Follow the instructions on the packaging. You can get this at your local pharmacy or grocery store.  Try applying baking soda paste to your skin. Stir water into baking soda until it reaches a paste-like consistency.  Do not scratch your skin.  Bathe less frequently, such as every other day.  Bathe in lukewarm water. Avoid using hot water. Medicines  Take or apply over-the-counter and prescription medicines only as told by your health care provider.   If you were prescribed an antibiotic medicine, take or apply your antibiotic as told by your health care provider. Do not stop using the   antibiotic even if your condition starts to improve. General Instructions  Keep all follow-up visits as told by your health care provider. This is important.  Avoid the substance that caused your reaction. If you do not know what caused it, keep a journal to try to track what caused it. Write down:  What you eat.  What cosmetic products you use.  What you drink.  What  you wear in the affected area. This includes jewelry.  If you were given a dressing, take care of it as told by your health care provider. This includes when to change and remove it. SEEK MEDICAL CARE IF:   Your condition does not improve with treatment.  Your condition gets worse.  You have signs of infection such as swelling, tenderness, redness, soreness, or warmth in the affected area.  You have a fever.  You have new symptoms. SEEK IMMEDIATE MEDICAL CARE IF:   You have a severe headache, neck pain, or neck stiffness.  You vomit.  You feel very sleepy.  You notice red streaks coming from the affected area.  Your bone or joint underneath the affected area becomes painful after the skin has healed.  The affected area turns darker.  You have difficulty breathing.   This information is not intended to replace advice given to you by your health care provider. Make sure you discuss any questions you have with your health care provider.   Document Released: 08/26/2000 Document Revised: 05/20/2015 Document Reviewed: 01/14/2015 Elsevier Interactive Patient Education 2016 [employer]  

## 2016-03-03 NOTE — ED Provider Notes (Signed)
CSN: [id number]     Arrival date & time 03/03/16  1059 History   First MD Initiated Contact with Patient 03/03/16 1223     Chief Complaint  Patient presents with  . Foot Swelling   (Consider location/radiation/quality/duration/timing/severity/associated sxs/prior Treatment) HPI   Is a 35 year old male who presents with swelling and pain in both of his feet for the last 3 days. He is also noticed some itchy red bumps on his feet and some open sores. 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 Recently been using new shoes which are crocs that his father ordered a size 2 small to fit properly. He is use these new shoes intermittently but for the last couple days has been using continuously. He also has some rough itchy skin on his right upper extremity and states that his hands are immersed in water almost constantly and is unsure of all the chemicals that are in the detergents. Is any shortness of breath palpitations chest pain calf pain. He has worked at [employer] for 3 years.     History reviewed. No pertinent past medical history. History reviewed. No pertinent past surgical history. Family History  Problem Relation Age of Onset  . Cancer Mother     breast  . Diabetes Father   . Hypertension Father   . Crohn's disease Brother   . Drug abuse Brother    Social History  Substance Use Topics  . Smoking status: Current Every Day Smoker -- 0.50 packs/day    Types: Cigarettes  . Smokeless tobacco: None  . Alcohol Use: Yes    Review of Systems  Constitutional: Positive for activity change. Negative for chills, appetite change and fatigue.  Respiratory: Negative for cough, chest tightness, shortness of breath, wheezing and stridor.   All other systems reviewed and are negative.   Allergies  Review of patient's allergies indicates no known allergies.  Home Medications   Prior to Admission medications    Medication Sig Start Date End Date Taking? Authorizing Provider  Cholecalciferol (VITAMIN D3) 5000 UNITS CAPS Take 1 capsule (5,000 Units total) by mouth daily. 03/05/15   Schuyler AmorWilliam Plonk, MD  methylPREDNISolone (MEDROL DOSEPAK) 4 MG TBPK tablet Take as per instructions 03/03/16   Lutricia FeilWilliam P Roemer, PA-C  mupirocin ointment (BACTROBAN) 2 % Apply 1 application topically 3 (three) times daily. 03/03/16   Lutricia FeilWilliam P Roemer, PA-C  triamcinolone cream (KENALOG) 0.1 % Apply 1 application topically 2 (two) times daily. 03/03/16   Lutricia FeilWilliam P Roemer, PA-C   Meds Ordered and Administered this Visit  Medications - No data to display  BP 168/80 mmHg  Pulse 108  Temp(Src) 98 F (36.7 C) (Oral)  Resp 16  Ht 5\' 11"  (1.803 m)  Wt 265 lb (120.203 kg)  BMI 36.98 kg/m2  SpO2 96% No data found.   Physical Exam  Constitutional: He is oriented to person, place, and time. He appears well-developed and well-nourished. No distress.  HENT:  Head: Normocephalic and atraumatic.  Eyes: Conjunctivae are normal. Pupils are equal, round, and reactive to light.  Neck: Normal range of motion. Neck supple.  Musculoskeletal: Normal range of motion. He exhibits edema and tenderness.  Lymphadenopathy:    He has no cervical adenopathy.  Neurological: He is alert and oriented to person, place, and time.  Skin: Rash noted. He is not diaphoretic. There is erythema.  Examination of both feet shows erythema and mild  swelling but no pitting edema is present. Does blanch rattly. Extends up to approximately 1 hand's breath above his ankles. His bilateral. Does have some skin breakdown of both feet in the form of small abrasions with some secondary infection. There is no drainage present. There is no calf tenderness.  Examination of his upper extremities shows a scaly dry lesion over the ulnar aspect of his hand and also over the ulnar aspect of his middle forearm. It is not infected  Psychiatric: He has a normal mood and affect. His  behavior is normal. Judgment and thought content normal.  Nursing note and vitals reviewed.   ED Course  Procedures (including critical care time)  Labs Review Labs Reviewed - No data to display  Imaging Review No results found.   Visual Acuity Review  Right Eye Distance:   Left Eye Distance:   Bilateral Distance:    Right Eye Near:   Left Eye Near:    Bilateral Near:         MDM   1. Contact dermatitis and eczema    Discharge Medication List as of 03/03/2016 12:40 PM    START taking these medications   Details  methylPREDNISolone (MEDROL DOSEPAK) 4 MG TBPK tablet Take as per instructions, Normal    mupirocin ointment (BACTROBAN) 2 % Apply 1 application topically 3 (three) times daily., Starting 03/03/2016, Until Discontinued, Normal    triamcinolone cream (KENALOG) 0.1 % Apply 1 application topically 2 (two) times daily., Starting 03/03/2016, Until Discontinued, Normal      Plan: 1. Test/x-ray results and diagnosis reviewed with patient 2. rx as per orders; risks, benefits, potential side effects reviewed with patient 3. Recommend supportive treatment with Wearing the new crocs and returning to wearing older shoes that have not bothered him. Try his best to prevent any water from getting in his shoes or passes) cemented developed a reaction to the detergents or they may have changed detergents recently at any rate he should avoid all contact if at all possible. In the meantime he should elevate his legs sufficiently to help with the edema. If he is not improving in 4-7 days he should make an appointment with a dermatologist for further evaluation.. This does not appear to be systemic at this time but if his symptoms worsen or he develops any shortness of breath should be seen in emergency room. 4. F/u prn if symptoms worsen or don't improve     Lutricia FeilWilliam P Roemer, PA-C 03/03/16 1254

## 2016-03-03 NOTE — ED Notes (Signed)
Patient c/o swelling and pain in both feet for the past 3 days.  Patient also reports itchy red bumps on his feet.  Patient denies fevers.

## 2016-06-10 DIAGNOSIS — E119 Type 2 diabetes mellitus without complications: Secondary | ICD-10-CM | POA: Insufficient documentation

## 2016-06-10 DIAGNOSIS — R0789 Other chest pain: Secondary | ICD-10-CM | POA: Insufficient documentation

## 2016-06-10 DIAGNOSIS — F17210 Nicotine dependence, cigarettes, uncomplicated: Secondary | ICD-10-CM | POA: Diagnosis not present

## 2016-06-10 DIAGNOSIS — R0602 Shortness of breath: Secondary | ICD-10-CM | POA: Diagnosis present

## 2016-06-10 NOTE — ED Triage Notes (Signed)
Reports that he got dizzy, chilled and slight shortness of breath while at work.  Went home and had family member check his blood pressure (220/90).

## 2016-06-11 ENCOUNTER — Emergency Department
Admission: EM | Admit: 2016-06-11 | Discharge: 2016-06-11 | Disposition: A | Discharge status: Home / Self Care | Payer: BLUE CROSS/BLUE SHIELD | Attending: Emergency Medicine | Admitting: Emergency Medicine

## 2016-06-11 ENCOUNTER — Emergency Department: Payer: BLUE CROSS/BLUE SHIELD

## 2016-06-11 DIAGNOSIS — R079 Chest pain, unspecified: Secondary | ICD-10-CM

## 2016-06-11 LAB — CBC WITH DIFFERENTIAL/PLATELET
BASOS PCT: 1 %
Basophils Absolute: 0.1 10*3/uL (ref 0–0.1)
EOS ABS: 0 10*3/uL (ref 0–0.7)
EOS PCT: 0 %
HCT: 49.5 % (ref 40.0–52.0)
HEMOGLOBIN: 16.7 g/dL (ref 13.0–18.0)
Lymphocytes Relative: 12 %
Lymphs Abs: 1.3 10*3/uL (ref 1.0–3.6)
MCH: 29.7 pg (ref 26.0–34.0)
MCHC: 33.7 g/dL (ref 32.0–36.0)
MCV: 88 fL (ref 80.0–100.0)
MONO ABS: 0.5 10*3/uL (ref 0.2–1.0)
MONOS PCT: 5 %
NEUTROS PCT: 82 %
Neutro Abs: 8.7 10*3/uL — ABNORMAL HIGH (ref 1.4–6.5)
PLATELETS: 224 10*3/uL (ref 150–440)
RBC: 5.62 MIL/uL (ref 4.40–5.90)
RDW: 14.6 % — AB (ref 11.5–14.5)
WBC: 10.6 10*3/uL (ref 3.8–10.6)

## 2016-06-11 LAB — COMPREHENSIVE METABOLIC PANEL
ALBUMIN: 4.3 g/dL (ref 3.5–5.0)
ALK PHOS: 70 U/L (ref 38–126)
ALT: 35 U/L (ref 17–63)
ANION GAP: 4 — AB (ref 5–15)
AST: 22 U/L (ref 15–41)
BILIRUBIN TOTAL: 0.6 mg/dL (ref 0.3–1.2)
BUN: 14 mg/dL (ref 6–20)
CALCIUM: 9.1 mg/dL (ref 8.9–10.3)
CO2: 30 mmol/L (ref 22–32)
CREATININE: 0.77 mg/dL (ref 0.61–1.24)
Chloride: 102 mmol/L (ref 101–111)
GFR calc Af Amer: >60 mL/min (ref >60)
GFR calc non Af Amer: >60 mL/min (ref >60)
GLUCOSE: 114 mg/dL — AB (ref 65–99)
Potassium: 4.2 mmol/L (ref 3.5–5.1)
Sodium: 136 mmol/L (ref 135–145)
TOTAL PROTEIN: 8.1 g/dL (ref 6.5–8.1)

## 2016-06-11 LAB — TROPONIN I: Troponin I: <0.03 ng/mL (ref <0.03)

## 2016-06-11 LAB — BRAIN NATRIURETIC PEPTIDE: B NATRIURETIC PEPTIDE 5: 22 pg/mL (ref 0.0–100.0)

## 2016-06-11 MED ORDER — ASPIRIN 81 MG PO CHEW
324.0000 mg | CHEWABLE_TABLET | Freq: Once | ORAL | Status: AC
  Administered 2016-06-11: 324 mg via ORAL
  Filled 2016-06-11: qty 4

## 2016-06-11 NOTE — ED Notes (Signed)
MD at bedside. 

## 2016-06-11 NOTE — ED Provider Notes (Signed)
[facility] Emergency Department Provider Note   ____________________________________________   First MD Initiated Contact with Patient 06/11/16 0010     (approximate)  I have reviewed the triage vital signs and the nursing notes.   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 [employer] reports that while he was at work today he had an episode of arm is feeling very cold chest feeling tight got dizzy and shortness of breath. Chest tightness lasted until he got home. When he got home his blood pressure was 220/90. He feels fine now. He reports he's had chest tightness with physical exercise for about the last week or so. Comes on with exercise and goes away when he finishes.   No past medical history on file.  Patient Active Problem List   Diagnosis Date Noted  . Vitamin D deficiency 03/05/2015  . Obesity (BMI 30-39.9) 03/04/2015  . Smoker 03/04/2015  . Prediabetes 03/04/2015  . Hx of opioid abuse 03/04/2015  . FH: diabetes mellitus 03/04/2015  . FH: Crohn's disease 03/04/2015    No past surgical history on file.  Prior to Admission medications   Medication Sig Start Date End Date Taking? Authorizing Provider  Cholecalciferol (VITAMIN D3) 5000 UNITS CAPS Take 1 capsule (5,000 Units total) by mouth daily. 03/05/15   Schuyler AmorWilliam Plonk, MD  methylPREDNISolone (MEDROL DOSEPAK) 4 MG TBPK tablet Take as per instructions 03/03/16   Lutricia FeilWilliam P Roemer, PA-C  mupirocin ointment (BACTROBAN) 2 % Apply 1 application topically 3 (three) times daily. 03/03/16   Lutricia FeilWilliam P Roemer, PA-C  triamcinolone cream (KENALOG) 0.1 % Apply 1 application topically 2 (two) times daily. 03/03/16   Lutricia FeilWilliam P Roemer, PA-C    Allergies Review of patient's allergies indicates no known allergies.  Family History  Problem Relation Age of Onset  . Cancer Mother     breast  . Diabetes Father   . Hypertension Father   .  Crohn's disease Brother   . Drug abuse Brother     Social History Social History  Substance Use Topics  . Smoking status: Current Every Day Smoker    Packs/day: 0.50    Types: Cigarettes  . Smokeless tobacco: Not on file  . Alcohol use Yes    Review of Systems Constitutional: No fever/chills Eyes: No visual changes. ENT: No sore throat. Cardiovascular: See history of present illness Respiratory: See history of present illness Gastrointestinal: No abdominal pain.  No nausea, no vomiting.  No diarrhea.  No constipation. Genitourinary: Negative for dysuria. Musculoskeletal: Negative for back pain. Skin: Negative for rash. Neurological: Negative for headaches, focal weakness or numbness.  10-point ROS otherwise negative.  ____________________________________________   PHYSICAL EXAM:  VITAL SIGNS: ED Triage Vitals [06/10/16 2037]  Enc Vitals Group     BP (!) 114/96     Pulse Rate 82     Resp 20     Temp 98.2 F (36.8 C)     Temp Source Oral     SpO2 99 %     Weight 270 lb (122.5 kg)     Height 5\' 11"  (1.803 m)     Head Circumference      Peak Flow      Pain Score      Pain Loc      Pain Edu?      Excl. in GC?     Constitutional: Alert and oriented. Well appearing and in  no acute distress. Eyes: Conjunctivae are normal. PERRL. EOMI. Head: Atraumatic. Nose: No congestion/rhinnorhea. Mouth/Throat: Mucous membranes are moist.  Oropharynx non-erythematous. Neck: No stridor.  Cardiovascular: Normal rate, regular rhythm. Grossly normal heart sounds.  Good peripheral circulation. Respiratory: Normal respiratory effort.  No retractions. Lungs CTAB. Gastrointestinal: Soft and nontender. No distention. No abdominal bruits. No CVA tenderness. Musculoskeletal: No lower extremity tenderness nor edema.  No joint effusions. Neurologic:  Normal speech and language. No gross focal neurologic deficits are appreciated.  Skin:  Skin is warm, dry and intact. No rash  noted.   ____________________________________________   LABS (all labs ordered are listed, but only abnormal results are displayed)  Labs Reviewed  COMPREHENSIVE METABOLIC PANEL - Abnormal; Notable for the following:       Result Value   Glucose, Bld 114 (*)    Anion gap 4 (*)    All other components within normal limits  CBC WITH DIFFERENTIAL/PLATELET - Abnormal; Notable for the following:    RDW 14.6 (*)    Neutro Abs 8.7 (*)    All other components within normal limits  TROPONIN I  TROPONIN I  BRAIN NATRIURETIC PEPTIDE   ____________________________________________  EKG  EKG read and interpreted by me shows sinus rhythm normal axis there is some T-wave inversion in V3 and 4 but is very slight. Repeated the EKG EKG #2 also sinus rhythm normal axis slight T-wave inversion as previously no change from #1 and #2 is significant. She did EKGs with the hospitalist we agree that this does not appear to be in a first deep enough T-wave inversion to Wellens syndrome. ____________________________________________  RADIOLOGY  EXAM: PORTABLE CHEST 1 VIEW  COMPARISON:  Chest radiograph from 02/22/2015  FINDINGS: The lungs are well-aerated. Vascular congestion is noted. Mildly increased interstitial markings could reflect minimal interstitial edema. There is no evidence of pleural effusion or pneumothorax.  The cardiomediastinal silhouette is mildly enlarged. No acute osseous abnormalities are seen.  IMPRESSION: Vascular congestion and mild cardiomegaly. Mildly increased interstitial markings could reflect mild interstitial edema.   Electronically Signed   By: Roanna Raider M.D.   On: 06/11/2016 00:35  ____________________________________________   PROCEDURES  Procedure(s) performed:   Procedures  Critical Care performed:   ____________________________________________   INITIAL IMPRESSION / ASSESSMENT AND PLAN / ED COURSE  Pertinent labs & imaging  results that were available during my care of the patient were reviewed by me and considered in my medical decision making (see chart for details).  Hospitalist discuss this with patient patient wants to go follow up with cardiology and coronal clinic. I will give him a referral for coronal clinic and also for Dr.Khan who was on-call for unassigned.  Clinical Course     ____________________________________________   FINAL CLINICAL IMPRESSION(S) / ED DIAGNOSES  Final diagnoses:  Chest pain, unspecified chest pain type      NEW MEDICATIONS STARTED DURING THIS VISIT:  New Prescriptions   No medications on file     Note:  This document was prepared using Dragon voice recognition software and may include unintentional dictation errors.    Arnaldo Natal, MD 06/11/16 [phone]

## 2016-06-11 NOTE — Discharge Instructions (Signed)
Take 1 aspirin once a day. Rest today Sunday tomorrow go follow up with Dr. Serita ShellerFA [facility] cardiology at coronal clinic or Dr. Dorothyann PengK AJ and cardiology [facility]. Call them first thing in the morning about 8:30. They should be on to get you an appointment Monday especially if you tell them you were in the emergency room and had chest pain. Again take it easy today. She develop any more chest pain or shortness of breath, 911 or return here.

## 2016-06-14 ENCOUNTER — Encounter: Payer: Self-pay | Admitting: Cardiology

## 2016-06-14 ENCOUNTER — Ambulatory Visit (INDEPENDENT_AMBULATORY_CARE_PROVIDER_SITE_OTHER): Payer: BLUE CROSS/BLUE SHIELD | Admitting: Cardiology

## 2016-06-14 VITALS — BP 180/100 | HR 94 | Ht 71.0 in | Wt 271.0 lb

## 2016-06-14 DIAGNOSIS — I10 Essential (primary) hypertension: Secondary | ICD-10-CM

## 2016-06-14 DIAGNOSIS — R9431 Abnormal electrocardiogram [ECG] [EKG]: Secondary | ICD-10-CM | POA: Diagnosis not present

## 2016-06-14 DIAGNOSIS — R079 Chest pain, unspecified: Secondary | ICD-10-CM

## 2016-06-14 MED ORDER — AMLODIPINE BESYLATE 5 MG PO TABS: 5.0000 mg | ORAL_TABLET | Freq: Every day | ORAL | 3 refills | Status: DC

## 2016-06-14 NOTE — Progress Notes (Signed)
Cardiology Office Note   Date:  06/14/2016   ID:  Ronald QuintChristopher Rhody, DOB 09-Mar-1981, MRN [id number]  Referring Doctor:  No PCP Per Patient   Cardiologist:   Almond LintAileen Tyechia Allmendinger, MD   Reason for consultation:  Chief Complaint  Patient presents with  . other    [facility]; HTN.       History of Present Illness: Ronald Arias is a 35 y.o. male who presents for Elevated blood pressure.  Presented to the ER 9 30 2017 with symptoms of chest discomfort, shortness of breath, dizziness. Blood pressure at home was 220/90. He was asked to follow-up in our office for further evaluation.  Also mentions chest discomfort, usually when his blood pressure is elevated or when he gets emotionally upset. Chest pressure is in the center of the chest, nonradiating, mild to moderate in intensity, lasting several minutes at a time, resolving spontaneously.  Patient denies shortness of breath, PND, orthopnea, palpitations.   ROS:  Please see the history of present illness. Aside from mentioned under HPI, all other systems are reviewed and negative.     History reviewed. No pertinent past medical history.  History reviewed. No pertinent surgical history.   reports that he quit smoking 7 days ago. His smoking use included Cigarettes. He smoked 0.50 packs per day. He has never used smokeless tobacco. He reports that he drinks alcohol. He reports that he does not use drugs.   family history includes Cancer in his mother; Crohn's disease in his brother; Diabetes in his father; Drug abuse in his brother; Hypertension in his father.   Outpatient Medications Prior to Visit  Medication Sig Dispense Refill  . Cholecalciferol (VITAMIN D3) 5000 UNITS CAPS Take 1 capsule (5,000 Units total) by mouth daily. 30 capsule   . methylPREDNISolone (MEDROL DOSEPAK) 4 MG TBPK tablet Take as per instructions 21 tablet 0  . mupirocin ointment (BACTROBAN) 2 % Apply 1 application topically 3 (three) times  daily. 22 g 0  . triamcinolone cream (KENALOG) 0.1 % Apply 1 application topically 2 (two) times daily. 30 g 0   No facility-administered medications prior to visit.      Allergies: Review of patient's allergies indicates no known allergies.    PHYSICAL EXAM: VS:  BP (!) 180/100 (BP Location: Left Arm, Patient Position: Sitting, Cuff Size: Normal)   Pulse 94   Ht 5\' 11"  (1.803 m)   Wt 271 lb (122.9 kg)   BMI 37.80 kg/m  , Body mass index is 37.8 kg/m. Wt Readings from Last 3 Encounters:  06/14/16 271 lb (122.9 kg)  06/10/16 270 lb (122.5 kg)  03/03/16 265 lb (120.2 kg)    GENERAL:  well developed, well nourished, obese, not in acute distress HEENT: normocephalic, pink conjunctivae, anicteric sclerae, no xanthelasma, normal dentition, oropharynx clear NECK:  no neck vein engorgement, JVP normal, no hepatojugular reflux, carotid upstroke brisk and symmetric, no bruit, no thyromegaly, no lymphadenopathy LUNGS:  good respiratory effort, clear to auscultation bilaterally CV:  PMI not displaced, no thrills, no lifts, S1 and S2 within normal limits, no palpable S3 or S4, no murmurs, no rubs, no gallops ABD:  Soft, nontender, nondistended, normoactive bowel sounds, no abdominal aortic bruit, no hepatomegaly, no splenomegaly MS: nontender back, no kyphosis, no scoliosis, no joint deformities EXT:  2+ DP/PT pulses, no edema, no varicosities, no cyanosis, no clubbing SKIN: warm, nondiaphoretic, normal turgor, no ulcers NEUROPSYCH: alert, oriented to person, place, and time, sensory/motor grossly intact, normal mood,  appropriate affect  Recent Labs: 06/11/2016: ALT 35; B Natriuretic Peptide 22.0; BUN 14; Creatinine, Ser 0.77; Hemoglobin 16.7; Platelets 224; Potassium 4.2; Sodium 136   Lipid Panel    Component Value Date/Time   CHOL 209 (H) 03/04/2015 1540   TRIG 78 03/04/2015 1540   HDL 60 03/04/2015 1540   CHOLHDL 3.5 03/04/2015 1540   LDLCALC 133 (H) 03/04/2015 1540     Other  studies Reviewed:  EKG:  The ekg from 06/14/2016 was personally reviewed by me and it revealed sinus rhythm, 94 BPM, nonspecific T-wave changes.  Additional studies/ records that were reviewed personally reviewed by me today include: None available   ASSESSMENT AND PLAN:  Elevated blood pressure likely hypertension Abnormal EKG with unspecific changes, recommend echocardiogram. Chest discomfort   Recommend to start antihypertensive medication. Trial of amlodipine 5 mg by mouth daily Blood pressure log Lifestyle changes including low sodium diet. Increase in physical activity is no longer with symptoms and if blood pressure is controlled. Weight loss recommended as well. Patient to follow-up in office in one month for further recommendations. If he continues to have chest discomfort despite blood pressure control, and that'll be the time to do a stress test.  Tobacco use We discussed the importance of smoking cessation and different strategies for quitting.      Current medicines are reviewed at length with the patient today.  The patient does not have concerns regarding medicines.  Labs/ tests ordered today include:  Orders Placed This Encounter  Procedures  . EKG 12-Lead    I had a lengthy and detailed discussion with the patient regarding diagnoses, prognosis, diagnostic options, treatment options, and side effects of medications.   I counseled the patient on importance of lifestyle modification including heart healthy diet, regular physical activity , and smoking cessation.   Disposition:   FU with undersigned after tests in one month   Signed, Almond Lint, MD  06/14/2016 2:50 PM    [facility]  This note was generated in part with voice recognition software and I apologize for any typographical errors that were not detected and corrected.

## 2016-06-14 NOTE — Patient Instructions (Addendum)
Medication Instructions:  Your physician has recommended you make the following change in your medication:  1. START Amlodipine 5 mg Once daily  Testing/Procedures: Your physician has requested that you have an echocardiogram. Echocardiography is a painless test that uses sound waves to create images of your heart. It provides your doctor with information about the size and shape of your heart and how well your heart's chambers and valves are working. This procedure takes approximately one hour. There are no restrictions for this procedure.  Follow-Up: Your physician recommends that you schedule a follow-up appointment in: 1 month with Dr. Alvino ChapelIngal. Bring BP log to that appointment.   Your physician has requested that you regularly monitor and record your blood pressure readings at home. Please use the same machine at the same time of day to check your readings and record them to bring to your follow-up visit.  It was a pleasure seeing you today here in the office. Please do not hesitate to give us a call back if you have any further questions. [phone]  Santa Rita CellarPamela A. RN, BSN     Echocardiogram An echocardiogram, or echocardiography, uses sound waves (ultrasound) to produce an image of your heart. The echocardiogram is simple, painless, obtained within a short period of time, and offers valuable information to your health care provider. The images from an echocardiogram can provide information such as:  Evidence of coronary artery disease (CAD).  Heart size.  Heart muscle function.  Heart valve function.  Aneurysm detection.  Evidence of a past heart attack.  Fluid buildup around the heart.  Heart muscle thickening.  Assess heart valve function. LET [facility] PROVIDER KNOW ABOUT:  Any allergies you have.  All medicines you are taking, including vitamins, herbs, eye drops, creams, and over-the-counter medicines.  Previous problems you or members of your family have had  with the use of anesthetics.  Any blood disorders you have.  Previous surgeries you have had.  Medical conditions you have.  Possibility of pregnancy, if this applies. BEFORE THE PROCEDURE  No special preparation is needed. Eat and drink normally.  PROCEDURE   In order to produce an image of your heart, gel will be applied to your chest and a wand-like tool (transducer) will be moved over your chest. The gel will help transmit the sound waves from the transducer. The sound waves will harmlessly bounce off your heart to allow the heart images to be captured in real-time motion. These images will then be recorded.  You may need an IV to receive a medicine that improves the quality of the pictures. AFTER THE PROCEDURE You may return to your normal schedule including diet, activities, and medicines, unless your health care provider tells you otherwise.   This information is not intended to replace advice given to you by your health care provider. Make sure you discuss any questions you have with your health care provider.   Document Released: 08/26/2000 Document Revised: 09/19/2014 Document Reviewed: 05/06/2013 Elsevier Interactive Patient Education [employer]2016 Elsevier Inc.

## 2016-06-27 ENCOUNTER — Encounter: Payer: Self-pay | Admitting: Emergency Medicine

## 2016-06-27 ENCOUNTER — Emergency Department
Admission: EM | Admit: 2016-06-27 | Discharge: 2016-06-27 | Disposition: A | Discharge status: Home / Self Care | Payer: BLUE CROSS/BLUE SHIELD | Attending: Emergency Medicine | Admitting: Emergency Medicine

## 2016-06-27 ENCOUNTER — Emergency Department: Payer: BLUE CROSS/BLUE SHIELD

## 2016-06-27 DIAGNOSIS — Z79899 Other long term (current) drug therapy: Secondary | ICD-10-CM | POA: Diagnosis not present

## 2016-06-27 DIAGNOSIS — R0789 Other chest pain: Secondary | ICD-10-CM | POA: Diagnosis not present

## 2016-06-27 DIAGNOSIS — R05 Cough: Secondary | ICD-10-CM | POA: Diagnosis not present

## 2016-06-27 DIAGNOSIS — Z7982 Long term (current) use of aspirin: Secondary | ICD-10-CM | POA: Diagnosis not present

## 2016-06-27 DIAGNOSIS — R062 Wheezing: Secondary | ICD-10-CM | POA: Diagnosis not present

## 2016-06-27 DIAGNOSIS — I10 Essential (primary) hypertension: Secondary | ICD-10-CM | POA: Insufficient documentation

## 2016-06-27 DIAGNOSIS — Z87891 Personal history of nicotine dependence: Secondary | ICD-10-CM | POA: Diagnosis not present

## 2016-06-27 HISTORY — DX: Essential (primary) hypertension: I10

## 2016-06-27 LAB — CBC
HEMATOCRIT: 49.3 % (ref 40.0–52.0)
HEMOGLOBIN: 16.8 g/dL (ref 13.0–18.0)
MCH: 29.6 pg (ref 26.0–34.0)
MCHC: 34.1 g/dL (ref 32.0–36.0)
MCV: 86.8 fL (ref 80.0–100.0)
Platelets: 217 10*3/uL (ref 150–440)
RBC: 5.68 MIL/uL (ref 4.40–5.90)
RDW: 14.1 % (ref 11.5–14.5)
WBC: 8.9 10*3/uL (ref 3.8–10.6)

## 2016-06-27 LAB — BASIC METABOLIC PANEL
ANION GAP: 6 (ref 5–15)
BUN: 14 mg/dL (ref 6–20)
CALCIUM: 9.2 mg/dL (ref 8.9–10.3)
CO2: 28 mmol/L (ref 22–32)
Chloride: 102 mmol/L (ref 101–111)
Creatinine, Ser: 0.75 mg/dL (ref 0.61–1.24)
GFR calc Af Amer: >60 mL/min (ref >60)
GLUCOSE: 128 mg/dL — AB (ref 65–99)
POTASSIUM: 4 mmol/L (ref 3.5–5.1)
Sodium: 136 mmol/L (ref 135–145)

## 2016-06-27 LAB — TROPONIN I: Troponin I: <0.03 ng/mL (ref <0.03)

## 2016-06-27 MED ORDER — ALBUTEROL SULFATE (2.5 MG/3ML) 0.083% IN NEBU
INHALATION_SOLUTION | RESPIRATORY_TRACT | Status: AC
  Administered 2016-06-27: 5 mg via RESPIRATORY_TRACT
  Filled 2016-06-27: qty 6

## 2016-06-27 MED ORDER — ALBUTEROL SULFATE (2.5 MG/3ML) 0.083% IN NEBU
5.0000 mg | INHALATION_SOLUTION | Freq: Once | RESPIRATORY_TRACT | Status: AC
  Administered 2016-06-27: 5 mg via RESPIRATORY_TRACT

## 2016-06-27 MED ORDER — ALBUTEROL SULFATE HFA 108 (90 BASE) MCG/ACT IN AERS: 2.0000 | INHALATION_SPRAY | Freq: Four times a day (QID) | RESPIRATORY_TRACT | 2 refills | Status: DC | PRN

## 2016-06-27 MED ORDER — IPRATROPIUM BROMIDE 0.02 % IN SOLN
0.5000 mg | Freq: Once | RESPIRATORY_TRACT | Status: AC
  Administered 2016-06-27: 0.5 mg via RESPIRATORY_TRACT
  Filled 2016-06-27: qty 2.5

## 2016-06-27 NOTE — Discharge Instructions (Signed)
If you have severe chest pain, severe shortness of breath, change in your symptoms, new or worrisome symptoms of any variety, we feel worse in any way return to the emergency room. Otherwise, follow closely with your cardiologist and with PCP. Use the albuterol as directed to see if it helps. Continue avoiding cigarettes.

## 2016-06-27 NOTE — ED Triage Notes (Signed)
C/O intermittent tightness in chest x 3 - 4 weeks. Seen through ED 3 weeks ago for same symptoms.  Patient was started on BP medication and Echo ordered and scheduled for the beginning of November.

## 2016-06-27 NOTE — ED Provider Notes (Addendum)
[facility] Emergency Department Provider Note  ____________________________________________   I have reviewed the triage vital signs and the nursing notes.   HISTORY  Chief Complaint Chest Pain    HPI Ronald Arias is a 35 y.o. male who presents today complaining of occasional cough and wheeze. He has had no fever. He states sometimes he feels a tightness in his chest from this. Patient used to be a very significant smoker. He quit smoking about a month ago. He had a negative cardiac workup for a cardiology as an outpatient for this symptom. He was noted to be hypertension started on hypertensive medications. He states since he quit smoking "well over" a pack a day, his cough is greatly receded. Sometimes however he does feel as if he has some tightness and wheeze. He denies any chest pain or shortness of breath. He denies any calf pain or swelling. He denies exertional symptoms. The patient states that he has had no fevers or chills. He has been using an albuterol that he borrowed from a family member, and has had some resolution of symptoms with it. He denies personal or family history of PE or DVT, he denies high cholesterol. He denies recent travel. He denies taking any dietary supplements, he denies any leg swelling, he denies any pleuritic chest pain. He has had no hemoptysis.    Past Medical History:  Diagnosis Date  . Hypertension     Patient Active Problem List   Diagnosis Date Noted  . Vitamin D deficiency 03/05/2015  . Obesity (BMI 30-39.9) 03/04/2015  . Smoker 03/04/2015  . Prediabetes 03/04/2015  . Hx of opioid abuse 03/04/2015  . FH: diabetes mellitus 03/04/2015  . FH: Crohn's disease 03/04/2015    History reviewed. No pertinent surgical history.  Prior to Admission medications   Medication Sig Start Date End Date Taking? Authorizing Provider  amLODipine (NORVASC) 5 MG tablet Take 1 tablet (5 mg total) by mouth daily. 06/14/16 09/12/16   Almond Lint, MD  Cholecalciferol (VITAMIN D3) 5000 UNITS CAPS Take 1 capsule (5,000 Units total) by mouth daily. 03/05/15   Schuyler Amor, MD  methylPREDNISolone (MEDROL DOSEPAK) 4 MG TBPK tablet Take as per instructions 03/03/16   Lutricia Feil, PA-C  mupirocin ointment (BACTROBAN) 2 % Apply 1 application topically 3 (three) times daily. 03/03/16   Lutricia Feil, PA-C  triamcinolone cream (KENALOG) 0.1 % Apply 1 application topically 2 (two) times daily. 03/03/16   Lutricia Feil, PA-C    Allergies Review of patient's allergies indicates no known allergies.  Family History  Problem Relation Age of Onset  . Cancer Mother     breast  . Diabetes Father   . Hypertension Father   . Crohn's disease Brother   . Drug abuse Brother     Social History Social History  Substance Use Topics  . Smoking status: Former Smoker    Packs/day: 0.50    Types: Cigarettes    Quit date: 06/07/2016  . Smokeless tobacco: Never Used     Comment: E-cigarettes  . Alcohol use Yes     Comment: 12 pack of beer per week.     Review of Systems Constitutional: No fever/chills Eyes: No visual changes. ENT: No sore throat. No stiff neck no neck pain Cardiovascular: Denies chest pain. Respiratory: See above Gastrointestinal:   no vomiting.  No diarrhea.  No constipation. Genitourinary: Negative for dysuria. Musculoskeletal: Negative lower extremity swelling Skin: Negative for rash. Neurological: Negative for severe headaches, focal weakness  or numbness. 10-point ROS otherwise negative.  ____________________________________________   PHYSICAL EXAM:  VITAL SIGNS: ED Triage Vitals  Enc Vitals Group     BP 06/27/16 1623 (!) 159/72     Pulse Rate 06/27/16 1623 99     Resp 06/27/16 1623 16     Temp 06/27/16 1623 97.8 F (36.6 C)     Temp src --      SpO2 06/27/16 1623 95 %     Weight 06/27/16 1625 260 lb (117.9 kg)     Height 06/27/16 1625 5\' 11"  (1.803 m)     Head Circumference --      Peak  Flow --      Pain Score 06/27/16 1625 5     Pain Loc --      Pain Edu? --      Excl. in GC? --     Constitutional: Alert and oriented. Well appearing and in no acute distress. Eyes: Conjunctivae are normal. PERRL. EOMI. Head: Atraumatic. Nose: No congestion/rhinnorhea. Mouth/Throat: Mucous membranes are moist.  Oropharynx non-erythematous. Neck: No stridor.   Nontender with no meningismus Cardiovascular: Normal rate, regular rhythm. Grossly normal heart sounds.  Good peripheral circulation. Respiratory: Normal respiratory effort.  No retractions. Lungs show an occasional slight wheeze no other pulmonary pathology detected Abdominal: Soft and nontender. No distention. No guarding no rebound Back:  There is no focal tenderness or step off.  there is no midline tenderness there are no lesions noted. there is no CVA tenderness Musculoskeletal: No lower extremity tenderness, no upper extremity tenderness. No joint effusions, no DVT signs strong distal pulses no edema Neurologic:  Normal speech and language. No gross focal neurologic deficits are appreciated.  Skin:  Skin is warm, dry and intact. No rash noted. Psychiatric: Mood and affect are normal. Speech and behavior are normal.  ____________________________________________   LABS (all labs ordered are listed, but only abnormal results are displayed)  Labs Reviewed  BASIC METABOLIC PANEL - Abnormal; Notable for the following:       Result Value   Glucose, Bld 128 (*)    All other components within normal limits  CBC  TROPONIN I   ____________________________________________  EKG  I personally interpreted any EKGs ordered by me or triage Normal sinus rhythm rate 84 bpm no acute ST elevation or acute ST depression ____________________________________________  RADIOLOGY  I reviewed any imaging ordered by me or triage that were performed during my shift and, if possible, patient and/or family made aware of any abnormal  findings. ____________________________________________   PROCEDURES  Procedure(s) performed: None  Procedures  Critical Care performed: None  ____________________________________________   INITIAL IMPRESSION / ASSESSMENT AND PLAN / ED COURSE  Pertinent labs & imaging results that were available during my care of the patient were reviewed by me and considered in my medical decision making (see chart for details).  Patient with a sensation of wheezing and test tightness which apparently is actually getting much better since he quit smoking. He still does use E cigarettes however. In any event, he had a slight wheeze which is cleared with albuterol and he has no other complaints. He has been seen and evaluated by cardiology. He has been feeling this way for over a month although as he states is been improving. There is no evidence of pneumonia but there is evidence of probably early COPD changes on chest x-ray. Low suspicion for PE in this patient. He is perk negative and has no risk factors. Low suspicion for  ACS and has normal EKG and normal troponin despite having symptoms since yesterday. The patient has no evidence of dissection. I have congratulated him on his smoking and I have given him albuterol which has made him feel a great deal better. We'll discharge him home on albuterol. I do not think steroids are indicated at this time. He does have outpatient follow-up both with cardiology and with his PCP. I have encouraged weight loss. Any new or worrisome symptoms he understands most mandate return to the emergency department.At this time, there does not appear to be clinical evidence to support the diagnosis of pulmonary embolus, dissection, myocarditis, endocarditis, pericarditis, pericardial tamponade, acute coronary syndrome, pneumothorax, pneumonia, or any other acute intrathoracic pathology that will require admission or acute intervention. Nor is there evidence of any significant  intra-abdominal pathology causing this discomfort.  ----------------------------------------- 6:46 PM on 06/27/2016 -----------------------------------------  All of patient's symptoms cleared up "100%" after breathing treatment, and his lungs are completely clear. I do not think he requires steroids given this fact. He is very grateful for the albuterol prescription and I have advised close outpatient follow-up and return precautions  Clinical Course   ____________________________________________   FINAL CLINICAL IMPRESSION(S) / ED DIAGNOSES  Final diagnoses:  None      This chart was dictated using voice recognition software.  Despite best efforts to proofread,  errors can occur which can change meaning.      Jeanmarie PlantJames A Palmira Stickle, MD 06/27/16 1817    Jeanmarie PlantJames A Candi Profit, MD 06/27/16 [phone]

## 2016-07-09 ENCOUNTER — Observation Stay
Admission: EM | Admit: 2016-07-09 | Discharge: 2016-07-09 | Disposition: A | Discharge status: Home / Self Care | Payer: BLUE CROSS/BLUE SHIELD | Attending: Internal Medicine | Admitting: Internal Medicine

## 2016-07-09 ENCOUNTER — Observation Stay
Admit: 2016-07-09 | Discharge: 2016-07-09 | Disposition: A | Discharge status: Home / Self Care | Payer: BLUE CROSS/BLUE SHIELD | Attending: Internal Medicine | Admitting: Internal Medicine

## 2016-07-09 ENCOUNTER — Emergency Department: Payer: BLUE CROSS/BLUE SHIELD

## 2016-07-09 ENCOUNTER — Encounter: Payer: Self-pay | Admitting: Emergency Medicine

## 2016-07-09 DIAGNOSIS — F17210 Nicotine dependence, cigarettes, uncomplicated: Secondary | ICD-10-CM | POA: Diagnosis not present

## 2016-07-09 DIAGNOSIS — I10 Essential (primary) hypertension: Secondary | ICD-10-CM

## 2016-07-09 DIAGNOSIS — Z6837 Body mass index (BMI) 37.0-37.9, adult: Secondary | ICD-10-CM | POA: Diagnosis not present

## 2016-07-09 DIAGNOSIS — R42 Dizziness and giddiness: Secondary | ICD-10-CM | POA: Insufficient documentation

## 2016-07-09 DIAGNOSIS — R9431 Abnormal electrocardiogram [ECG] [EKG]: Secondary | ICD-10-CM

## 2016-07-09 DIAGNOSIS — R0789 Other chest pain: Principal | ICD-10-CM | POA: Insufficient documentation

## 2016-07-09 DIAGNOSIS — E119 Type 2 diabetes mellitus without complications: Secondary | ICD-10-CM | POA: Insufficient documentation

## 2016-07-09 DIAGNOSIS — E876 Hypokalemia: Secondary | ICD-10-CM | POA: Diagnosis not present

## 2016-07-09 LAB — BASIC METABOLIC PANEL
ANION GAP: 7 (ref 5–15)
BUN: 14 mg/dL (ref 6–20)
CHLORIDE: 99 mmol/L — AB (ref 101–111)
CO2: 30 mmol/L (ref 22–32)
Calcium: 9.1 mg/dL (ref 8.9–10.3)
Creatinine, Ser: 0.74 mg/dL (ref 0.61–1.24)
GFR calc non Af Amer: >60 mL/min (ref >60)
Glucose, Bld: 171 mg/dL — ABNORMAL HIGH (ref 65–99)
Potassium: 3.4 mmol/L — ABNORMAL LOW (ref 3.5–5.1)
Sodium: 136 mmol/L (ref 135–145)

## 2016-07-09 LAB — CBC
HEMATOCRIT: 46 % (ref 40.0–52.0)
HEMOGLOBIN: 16.1 g/dL (ref 13.0–18.0)
MCH: 30.5 pg (ref 26.0–34.0)
MCHC: 35.1 g/dL (ref 32.0–36.0)
MCV: 86.9 fL (ref 80.0–100.0)
Platelets: 180 10*3/uL (ref 150–440)
RBC: 5.29 MIL/uL (ref 4.40–5.90)
RDW: 13.9 % (ref 11.5–14.5)
WBC: 8.7 10*3/uL (ref 3.8–10.6)

## 2016-07-09 LAB — ECHOCARDIOGRAM COMPLETE
E decel time: 271 msec
EERAT: 8.92
FS: 37 % (ref 28–44)
Height: 71 in
IVS/LV PW RATIO, ED: 0.84
LADIAMINDEX: 1.86 cm/m2
LASIZE: 47 mm
LAVOLA4C: 69.2 mL
LEFT ATRIUM END SYS DIAM: 47 mm
LV E/e'average: 8.92
LV TDI E'LATERAL: 14.8
LV e' LATERAL: 14.8 cm/s
LVEEMED: 8.92
MV Dec: 271
MV pk A vel: 94.1 m/s
MVPG: 7 mmHg
MVPKEVEL: 132 m/s
PW: 9.72 mm — AB (ref 0.6–1.1)
RV TAPSE: 25.4 mm
TDI e' medial: 11.5
Weight: 4340.8 oz

## 2016-07-09 LAB — LIPID PANEL
CHOL/HDL RATIO: 2.1 ratio
Cholesterol: 176 mg/dL (ref 0–200)
HDL: 82 mg/dL (ref >40)
LDL CALC: 84 mg/dL (ref 0–99)
Triglycerides: 50 mg/dL (ref <150)
VLDL: 10 mg/dL (ref 0–40)

## 2016-07-09 LAB — TROPONIN I: Troponin I: <0.03 ng/mL (ref <0.03)

## 2016-07-09 MED ORDER — ASPIRIN EC 81 MG PO TBEC: 81.0000 mg | DELAYED_RELEASE_TABLET | Freq: Every day | ORAL | Status: DC

## 2016-07-09 MED ORDER — SODIUM CHLORIDE 0.9 % IV SOLN: 250.0000 mL | INTRAVENOUS | Status: DC | PRN

## 2016-07-09 MED ORDER — ACETAMINOPHEN 325 MG PO TABS
650.0000 mg | ORAL_TABLET | ORAL | Status: DC | PRN
Start: 2016-07-09 — End: 2016-07-09

## 2016-07-09 MED ORDER — ONDANSETRON HCL 4 MG/2ML IJ SOLN: 4.0000 mg | Freq: Four times a day (QID) | INTRAMUSCULAR | Status: DC | PRN

## 2016-07-09 MED ORDER — ALBUTEROL SULFATE (2.5 MG/3ML) 0.083% IN NEBU: 3.0000 mL | INHALATION_SOLUTION | Freq: Four times a day (QID) | RESPIRATORY_TRACT | Status: DC | PRN

## 2016-07-09 MED ORDER — SODIUM CHLORIDE 0.9% FLUSH
3.0000 mL | Freq: Two times a day (BID) | INTRAVENOUS | Status: DC
  Administered 2016-07-09: 3 mL via INTRAVENOUS

## 2016-07-09 MED ORDER — INFLUENZA VAC SPLIT QUAD 0.5 ML IM SUSY: 0.5000 mL | PREFILLED_SYRINGE | INTRAMUSCULAR | Status: DC

## 2016-07-09 MED ORDER — NITROGLYCERIN 0.4 MG SL SUBL: 0.4000 mg | SUBLINGUAL_TABLET | SUBLINGUAL | Status: DC | PRN

## 2016-07-09 MED ORDER — SODIUM CHLORIDE 0.9% FLUSH: 3.0000 mL | INTRAVENOUS | Status: DC | PRN

## 2016-07-09 MED ORDER — ASPIRIN 81 MG PO CHEW
324.0000 mg | CHEWABLE_TABLET | Freq: Once | ORAL | Status: AC
  Administered 2016-07-09: 243 mg via ORAL
  Filled 2016-07-09: qty 4

## 2016-07-09 MED ORDER — ASPIRIN 81 MG PO CHEW: 324.0000 mg | CHEWABLE_TABLET | ORAL | Status: DC

## 2016-07-09 MED ORDER — ENOXAPARIN SODIUM 40 MG/0.4ML ~~LOC~~ SOLN
40.0000 mg | SUBCUTANEOUS | Status: DC
  Administered 2016-07-09: 40 mg via SUBCUTANEOUS
  Filled 2016-07-09: qty 0.4

## 2016-07-09 MED ORDER — METOPROLOL TARTRATE 25 MG PO TABS
12.5000 mg | ORAL_TABLET | Freq: Two times a day (BID) | ORAL | Status: DC
  Administered 2016-07-09: 12.5 mg via ORAL
  Filled 2016-07-09: qty 1

## 2016-07-09 MED ORDER — POTASSIUM CHLORIDE CRYS ER 20 MEQ PO TBCR
20.0000 meq | EXTENDED_RELEASE_TABLET | Freq: Once | ORAL | Status: AC
  Administered 2016-07-09: 20 meq via ORAL
  Filled 2016-07-09: qty 1

## 2016-07-09 MED ORDER — INFLUENZA VAC SPLIT QUAD 0.5 ML IM SUSY
0.5000 mL | PREFILLED_SYRINGE | Freq: Once | INTRAMUSCULAR | Status: AC
  Administered 2016-07-09: 0.5 mL via INTRAMUSCULAR

## 2016-07-09 MED ORDER — ASPIRIN 300 MG RE SUPP: 300.0000 mg | RECTAL | Status: DC

## 2016-07-09 MED ORDER — VITAMIN D 1000 UNITS PO TABS
5000.0000 [IU] | ORAL_TABLET | Freq: Every day | ORAL | Status: DC
  Administered 2016-07-09: 5000 [IU] via ORAL
  Filled 2016-07-09: qty 5

## 2016-07-09 MED ORDER — ASPIRIN 81 MG PO CHEW
CHEWABLE_TABLET | ORAL | Status: AC
  Administered 2016-07-09: 04:00:00
  Filled 2016-07-09: qty 1

## 2016-07-09 MED ORDER — AMLODIPINE BESYLATE 10 MG PO TABS: 10.0000 mg | ORAL_TABLET | Freq: Every day | ORAL | 2 refills | Status: DC

## 2016-07-09 NOTE — Discharge Summary (Signed)
[facility] [place] [facility]   PATIENT NAME: Ronald Arias    MR#:  [id number]  DATE OF BIRTH:  1981/01/09  DATE OF ADMISSION:  07/09/2016   ADMITTING PHYSICIAN: Ihor AustinPavan Pyreddy, MD  DATE OF DISCHARGE: 07/09/2016 PRIMARY CARE PHYSICIAN: No PCP Per Patient   ADMISSION DIAGNOSIS:  Abnormal EKG [R94.31] Atypical chest pain [R07.89] DISCHARGE DIAGNOSIS:  Principal Problem:   Dizziness and giddiness  SECONDARY DIAGNOSIS:   Past Medical History:  Diagnosis Date  . Hypertension    HOSPITAL COURSE:   1. Atypical chest discomfort, no ACS. Echocardiogram report is pending. Follow-up cardiologist, Dr Alvino ChapelIngal as outpatient. Lipid panel is not remarkable. The patient has no chest pain after admission. He said he has a lot of stress from work recently. 2. Dizziness and giddiness. Better.  3. Mild hypokalemia.  replaced potassium. 4. Hypertension. Better controlled, Increase Norvasc to 10 mg by mouth daily.  Morbid obesity. Advised exercise and diet control. Possible OSA. Follow-up PCP as outpatient for sleep study.  DISCHARGE CONDITIONS:  Stable, discharge to home today. CONSULTS OBTAINED:   DRUG ALLERGIES:  No Known Allergies DISCHARGE MEDICATIONS:     Medication List    TAKE these medications   albuterol 108 (90 Base) MCG/ACT inhaler Commonly known as:  PROVENTIL HFA;VENTOLIN HFA Inhale 2 puffs into the lungs every 6 (six) hours as needed for wheezing or shortness of breath.   amLODipine 10 MG tablet Commonly known as:  NORVASC Take 1 tablet (10 mg total) by mouth daily. What changed:  medication strength  how much to take        DISCHARGE INSTRUCTIONS:  See AVS.  If you experience worsening of your admission symptoms, develop shortness of breath, life threatening emergency, suicidal or homicidal thoughts you must seek medical attention immediately by calling 911 or calling your MD immediately  if symptoms less severe.  You Must  read complete instructions/literature along with all the possible adverse reactions/side effects for all the Medicines you take and that have been prescribed to you. Take any new Medicines after you have completely understood and accpet all the possible adverse reactions/side effects.   Please note  You were cared for by a hospitalist during your hospital stay. If you have any questions about your discharge medications or the care you received while you were in the hospital after you are discharged, you can call the unit and asked to speak with the hospitalist on call if the hospitalist that took care of you is not available. Once you are discharged, your primary care physician will handle any further medical issues. Please note that NO REFILLS for any discharge medications will be authorized once you are discharged, as it is imperative that you return to your primary care physician (or establish a relationship with a primary care physician if you do not have one) for your aftercare needs so that they can reassess your need for medications and monitor your lab values.    On the day of Discharge:  VITAL SIGNS:  Blood pressure 119/60, pulse 63, temperature 97.6 F (36.4 C), temperature source Oral, resp. rate 13, height 5\' 11"  (1.803 m), weight 271 lb 4.8 oz (123.1 kg), SpO2 98 %. PHYSICAL EXAMINATION:  GENERAL:  35 y.o.-year-old patient lying in the bed with no acute distress. Morbidly obese. EYES: Pupils equal, round, reactive to light and accommodation. No scleral icterus. Extraocular muscles intact.  HEENT: Head atraumatic, normocephalic. Oropharynx and nasopharynx clear.  NECK:  Supple, no jugular venous  distention. No thyroid enlargement, no tenderness.  LUNGS: Normal breath sounds bilaterally, no wheezing, rales,rhonchi or crepitation. No use of accessory muscles of respiration.  CARDIOVASCULAR: S1, S2 normal. No murmurs, rubs, or gallops.  ABDOMEN: Soft, non-tender, non-distended. Bowel  sounds present. No organomegaly or mass.  EXTREMITIES: No pedal edema, cyanosis, or clubbing.  NEUROLOGIC: Cranial nerves II through XII are intact. Muscle strength 5/5 in all extremities. Sensation intact. Gait not checked.  PSYCHIATRIC: The patient is alert and oriented x 3.  SKIN: No obvious rash, lesion, or ulcer.  DATA REVIEW:   CBC  Recent Labs Lab 07/09/16 0153  WBC 8.7  HGB 16.1  HCT 46.0  PLT 180    Chemistries   Recent Labs Lab 07/09/16 0153  NA 136  K 3.4*  CL 99*  CO2 30  GLUCOSE 171*  BUN 14  CREATININE 0.74  CALCIUM 9.1     Microbiology Results  Results for orders placed or performed during the hospital encounter of 02/21/15  Culture, blood (routine x 2)     Status: None   Collection Time: 02/21/15  3:56 PM  Result Value Ref Range Status   Specimen Description BLOOD  Final   Special Requests NONE  Final   Culture  Setup Time   Final    GRAM POSITIVE COCCI IN BOTH AEROBIC AND ANAEROBIC BOTTLES CRITICAL RESULT CALLED TO, READ BACK BY AND VERIFIED WITH: STEPHANIE BROTHERS AT [id number] 02/23/15.[facility] CONFIRMED BY [facility] CRITICAL VALUE NOTED.  VALUE IS CONSISTENT WITH PREVIOUSLY REPORTED AND CALLED VALUE. 02/23/15 BY CAF CONFIRMED BY [facility].    Culture   Final    COAGULASE NEGATIVE STAPHYLOCOCCUS IN BOTH AEROBIC AND ANAEROBIC BOTTLES POSSIBLE CONTAMINATION WITH SKIN FLORA TWO DIFFERENT COLONY MORPHOLOGIES    Report Status 02/26/2015 FINAL  Final    RADIOLOGY:  Dg Chest Portable 1 View  Result Date: 07/09/2016 CLINICAL DATA:  Sudden onset dizziness with shortness of breath EXAM: PORTABLE CHEST 1 VIEW COMPARISON:  06/27/2016 FINDINGS: Portable view chest demonstrates linear atelectasis or scar at the left base. There is stable bilateral pleural thickening. There is no acute infiltrate. Stable borderline enlarged cardiomediastinal. No pneumothorax. IMPRESSION: Stable bilateral pleural thickening and probable scarring at the left base. Borderline cardiomegaly. No  acute infiltrate. Electronically Signed   By: Jasmine PangKim  Fujinaga M.D.   On: 07/09/2016 04:40     Management plans discussed with the patient, family and they are in agreement.  CODE STATUS:     Code Status Orders        Start     Ordered   07/09/16 0612  Full code  Continuous     07/09/16 0611    Code Status History    Date Active Date Inactive Code Status Order ID Comments User Context   02/21/2015  8:06 PM 02/22/2015  2:56 PM Full Code [id number]  Marguarite ArbourJeffrey D Sparks, MD Inpatient      TOTAL TIME TAKING CARE OF THIS PATIENT: 25 minutes.    [name], Reg Bircher M.D on 07/09/2016 at 11:49 AM  Between 7am to 6pm - Pager - [phone]  After 6pm go to [email or web address] - Scientist, research (life [email or web address])password [email or web address]  [facility] [place] [facility]  Office  [phone]  CC: Primary care physician; No PCP Per Patient   Note: This dictation was prepared with Dragon dictation along with smaller phrase technology. Any transcriptional errors that result from this process are unintentional.

## 2016-07-09 NOTE — H&P (Signed)
[facility] [place] at [facility]   PATIENT NAME: Ronald Arias Stockard    MR#:  [id number]  DATE OF BIRTH:  09/24/1980  DATE OF ADMISSION:  07/09/2016  PRIMARY CARE PHYSICIAN: No PCP Per Patient   REQUESTING/REFERRING PHYSICIAN:   CHIEF COMPLAINT:   Chief Complaint  Patient presents with  . Dizziness  . Chest Pain    HISTORY OF PRESENT ILLNESS: Ronald Arias Ament  is a 35 y.o. male with a known history of Hypertension presented to the emergency room with lightheadedness and dizziness. Patient felt dizzy and lightheaded and also had some tightness in the chest. No complaints of any nausea or vomiting. Had an episode of shortness of breath and chest tightness. He had multiple visits to the emergency room in the past and was due to see a cardiologist in one week for outpatient appointment. Patient was evaluated in the emergency room first set of troponin was negative EKG showed T inversions in the anterior leads. Patient never had any cardiac workup done in the past, hospitalist service was consulted for further care of the patient. No complaints of any headache, blurry vision.  PAST MEDICAL HISTORY:   Past Medical History:  Diagnosis Date  . Hypertension     PAST SURGICAL HISTORY: Past Surgical History:  Procedure Laterality Date  . none      SOCIAL HISTORY:  Social History  Substance Use Topics  . Smoking status: Former Smoker    Packs/day: 0.50    Types: Cigarettes    Quit date: 06/07/2016  . Smokeless tobacco: Never Used     Comment: E-cigarettes  . Alcohol use Yes     Comment: 12 pack of beer per week.     FAMILY HISTORY:  Family History  Problem Relation Age of Onset  . Cancer Mother     breast  . Diabetes Father   . Hypertension Father   . Crohn's disease Brother   . Drug abuse Brother     DRUG ALLERGIES: No Known Allergies  REVIEW OF SYSTEMS:   CONSTITUTIONAL: No fever, fatigue or weakness.  EYES: No blurred or double vision.   EARS, NOSE, AND THROAT: No tinnitus or ear pain.  RESPIRATORY: No cough, shortness of breath, wheezing or hemoptysis.  CARDIOVASCULAR: Had chest tightness and an episode of shortness of breath. No orthopnea, edema.  GASTROINTESTINAL: No nausea, vomiting, diarrhea or abdominal pain.  GENITOURINARY: No dysuria, hematuria.  ENDOCRINE: No polyuria, nocturia,  HEMATOLOGY: No anemia, easy bruising or bleeding SKIN: No rash or lesion. MUSCULOSKELETAL: No joint pain or arthritis.   NEUROLOGIC: No tingling, numbness, weakness.  PSYCHIATRY: No anxiety or depression.   MEDICATIONS AT HOME:  Prior to Admission medications   Medication Sig Start Date End Date Taking? Authorizing Provider  albuterol (PROVENTIL HFA;VENTOLIN HFA) 108 (90 Base) MCG/ACT inhaler Inhale 2 puffs into the lungs every 6 (six) hours as needed for wheezing or shortness of breath. 06/27/16   Jeanmarie PlantJames A McShane, MD  amLODipine (NORVASC) 5 MG tablet Take 1 tablet (5 mg total) by mouth daily. 06/14/16 09/12/16  Almond LintAileen Ingal, MD  Cholecalciferol (VITAMIN D3) 5000 UNITS CAPS Take 1 capsule (5,000 Units total) by mouth daily. 03/05/15   Schuyler AmorWilliam Plonk, MD  methylPREDNISolone (MEDROL DOSEPAK) 4 MG TBPK tablet Take as per instructions 03/03/16   Lutricia FeilWilliam P Roemer, PA-C  mupirocin ointment (BACTROBAN) 2 % Apply 1 application topically 3 (three) times daily. 03/03/16   Lutricia FeilWilliam P Roemer, PA-C  triamcinolone cream (KENALOG) 0.1 % Apply 1 application  topically 2 (two) times daily. 03/03/16   Lutricia FeilWilliam P Roemer, PA-C      PHYSICAL EXAMINATION:   VITAL SIGNS: Blood pressure (!) 166/79, pulse 95, temperature 98.5 F (36.9 C), temperature source Oral, resp. rate 14, height 5\' 11"  (1.803 m), weight 122.5 kg (270 lb), SpO2 100 %.  GENERAL:  35 y.o.-year-old well built male patient lying in the bed with no acute distress.  EYES: Pupils equal, round, reactive to light and accommodation. No scleral icterus. Extraocular muscles intact.  HEENT: Head atraumatic,  normocephalic. Oropharynx and nasopharynx clear.  NECK:  Supple, no jugular venous distention. No thyroid enlargement, no tenderness.  LUNGS: Normal breath sounds bilaterally, no wheezing, rales,rhonchi or crepitation. No use of accessory muscles of respiration.  CARDIOVASCULAR: S1, S2 normal. No murmurs, rubs, or gallops.  ABDOMEN: Soft, nontender, nondistended. Bowel sounds present. No organomegaly or mass.  EXTREMITIES: No pedal edema, cyanosis, or clubbing.  NEUROLOGIC: Cranial nerves II through XII are intact. Muscle strength 5/5 in all extremities. Sensation intact. Gait not checked.  PSYCHIATRIC: The patient is alert and oriented x 3.  SKIN: No obvious rash, lesion, or ulcer.   LABORATORY PANEL:   CBC  Recent Labs Lab 07/09/16 0153  WBC 8.7  HGB 16.1  HCT 46.0  PLT 180  MCV 86.9  MCH 30.5  MCHC 35.1  RDW 13.9   ------------------------------------------------------------------------------------------------------------------  Chemistries   Recent Labs Lab 07/09/16 0153  NA 136  K 3.4*  CL 99*  CO2 30  GLUCOSE 171*  BUN 14  CREATININE 0.74  CALCIUM 9.1   ------------------------------------------------------------------------------------------------------------------ estimated creatinine clearance is 171.7 mL/min (by C-G formula based on SCr of 0.74 mg/dL). ------------------------------------------------------------------------------------------------------------------ No results for input(s): TSH, T4TOTAL, T3FREE, THYROIDAB in the last 72 hours.  Invalid input(s): FREET3   Coagulation profile No results for input(s): INR, PROTIME in the last 168 hours. ------------------------------------------------------------------------------------------------------------------- No results for input(s): DDIMER in the last 72 hours. -------------------------------------------------------------------------------------------------------------------  Cardiac  Enzymes  Recent Labs Lab 07/09/16 0153  TROPONINI <0.03   ------------------------------------------------------------------------------------------------------------------ Invalid input(s): POCBNP  ---------------------------------------------------------------------------------------------------------------  Urinalysis No results found for: COLORURINE, APPEARANCEUR, LABSPEC, PHURINE, GLUCOSEU, HGBUR, BILIRUBINUR, KETONESUR, PROTEINUR, UROBILINOGEN, NITRITE, LEUKOCYTESUR   RADIOLOGY: Dg Chest Portable 1 View  Result Date: 07/09/2016 CLINICAL DATA:  Sudden onset dizziness with shortness of breath EXAM: PORTABLE CHEST 1 VIEW COMPARISON:  06/27/2016 FINDINGS: Portable view chest demonstrates linear atelectasis or scar at the left base. There is stable bilateral pleural thickening. There is no acute infiltrate. Stable borderline enlarged cardiomediastinal. No pneumothorax. IMPRESSION: Stable bilateral pleural thickening and probable scarring at the left base. Borderline cardiomegaly. No acute infiltrate. Electronically Signed   By: Jasmine PangKim  Fujinaga M.D.   On: 07/09/2016 04:40    EKG: Orders placed or performed during the hospital encounter of 07/09/16  . EKG 12-Lead  . EKG 12-Lead  . ED EKG within 10 minutes  . ED EKG within 10 minutes    IMPRESSION AND PLAN: 35 year old male patient with history of hypertension presented to the emergency room with dizziness and giddiness. Had an episode of the shortness of breath and chest tightness. EKG showed T inversions. Admitting diagnosis 1. Atypical chest discomfort 2. Dizziness and giddiness 3. Mild hypokalemia 4. Hypertension. Treatment plan Admit patient to telemetry observation bed Aspirin 81 mg daily Check troponin to rule out ischemia Check echocardiogram Check lipid panel and replace potassium DVT prophylaxis with subcutaneous Lovenox 40 MG daily.  All the records are reviewed and case discussed with ED provider. Management  plans discussed with  the patient, family and they are in agreement.  CODE STATUS:FULL Code Status History    Date Active Date Inactive Code Status Order ID Comments User Context   02/21/2015  8:06 PM 02/22/2015  2:56 PM Full Code [id number]  Marguarite Arbour, MD Inpatient       TOTAL TIME TAKING CARE OF THIS PATIENT: 53 minutes.    Ihor Austin M.D on 07/09/2016 at 4:48 AM  Between 7am to 6pm - Pager - [phone]  After 6pm go to [email or web address] - password [email or web address] [facility]  [facility]  Office  [phone]  CC: Primary care physician; No PCP Per Patient

## 2016-07-09 NOTE — ED Triage Notes (Addendum)
Pt presents to ED with sudden onset of dizziness, sob, and chest tightness since around 1800 tonight while at work. Pt states he has been in this ED 2 times in the past month for the same and his "heart has checked out ok both times". Has follow up appt for Nov 7 and an Echo scheduled 2 weeks from Tuesday. Pt states his chest pain isnt his concern tonight and that he thinks his pain is coming from his lungs; both previous chest xrays were normal. Pt states he is "more concerned about the dizziness". Pt currently has no increased work of breathing noted. Skin warm and dry.

## 2016-07-09 NOTE — Discharge Instructions (Signed)
Exercise and diet control. Smoking cessation.

## 2016-07-09 NOTE — Progress Notes (Signed)
[facility] - [place] at [facility]        [name] was admitted to the Hospital on 07/09/2016 and Discharged  07/09/2016 and should be excused from work/school   for 2  days starting 07/09/2016 , may return to work/school without any restrictions.  [name], [name] M.D on 07/09/2016,at 11:11 AM  [facility] - [place] at [facility]    Office  [phone]

## 2016-07-09 NOTE — Progress Notes (Signed)
Discharge instruction given and explained to patient, verbalized understanding. Home med given to patient as well. Patient was vaccinated with flu vaccine. Patient was wheeled out in no distress.

## 2016-07-09 NOTE — ED Provider Notes (Signed)
[facility] Emergency Department Provider Note   ____________________________________________   First MD Initiated Contact with Patient 07/09/16 [phone]     (approximate)  I have reviewed the triage vital signs and the nursing notes.   HISTORY  Chief Complaint Dizziness and Chest Pain    HPI Ronald Arias is a 35 y.o. male who comes in with a history of having taken his blood pressure medicine about an hour prior to going to work. She was at work lifting 60 pound racks of dishes and washing dishes and had an episode of some lightheadedness little bit of shortness of breath and some chest tightness. Patient reports shortness of breath went away with his inhaler. he reports he's had this occasionally in the last month always about an hour after he takes his blood pressure medicine. He works nights as a Public affairs consultantdishwasher and always takes his medicine when he wakes up after sleeping during the day. He denies any chest pain there is no pleuritic pain. Patient reports his mother has similar symptoms when she takes her blood pressure medicine and took her many days before the symptoms improved. Patient has follow up with cardiology scheduled already. Patient had no symptoms of vertigo.   Past Medical History:  Diagnosis Date  . Hypertension     Patient Active Problem List   Diagnosis Date Noted  . Vitamin D deficiency 03/05/2015  . Obesity (BMI 30-39.9) 03/04/2015  . Smoker 03/04/2015  . Prediabetes 03/04/2015  . Hx of opioid abuse 03/04/2015  . FH: diabetes mellitus 03/04/2015  . FH: Crohn's disease 03/04/2015    History reviewed. No pertinent surgical history.  Prior to Admission medications   Medication Sig Start Date End Date Taking? Authorizing Provider  albuterol (PROVENTIL HFA;VENTOLIN HFA) 108 (90 Base) MCG/ACT inhaler Inhale 2 puffs into the lungs every 6 (six) hours as needed for wheezing or shortness of breath. 06/27/16   Jeanmarie PlantJames A McShane, MD    amLODipine (NORVASC) 5 MG tablet Take 1 tablet (5 mg total) by mouth daily. 06/14/16 09/12/16  Almond LintAileen Ingal, MD  Cholecalciferol (VITAMIN D3) 5000 UNITS CAPS Take 1 capsule (5,000 Units total) by mouth daily. 03/05/15   Schuyler AmorWilliam Plonk, MD  methylPREDNISolone (MEDROL DOSEPAK) 4 MG TBPK tablet Take as per instructions 03/03/16   Lutricia FeilWilliam P Roemer, PA-C  mupirocin ointment (BACTROBAN) 2 % Apply 1 application topically 3 (three) times daily. 03/03/16   Lutricia FeilWilliam P Roemer, PA-C  triamcinolone cream (KENALOG) 0.1 % Apply 1 application topically 2 (two) times daily. 03/03/16   Lutricia FeilWilliam P Roemer, PA-C    Allergies Review of patient's allergies indicates no known allergies.  Family History  Problem Relation Age of Onset  . Cancer Mother     breast  . Diabetes Father   . Hypertension Father   . Crohn's disease Brother   . Drug abuse Brother     Social History Social History  Substance Use Topics  . Smoking status: Former Smoker    Packs/day: 0.50    Types: Cigarettes    Quit date: 06/07/2016  . Smokeless tobacco: Never Used     Comment: E-cigarettes  . Alcohol use Yes     Comment: 12 pack of beer per week.     Review of Systems Constitutional: No fever/chills Eyes: No visual changes. ENT: No sore throat. Cardiovascular: See history of present illness Respiratory: See history of present illness Gastrointestinal: No abdominal pain.  No nausea, no vomiting.  No diarrhea.  No constipation. Genitourinary: Negative for dysuria.  Musculoskeletal: Negative for back pain. Skin: Negative for rash. Neurological: Negative for headaches, focal weakness or numbness.  10-point ROS otherwise negative.  ____________________________________________   PHYSICAL EXAM:  VITAL SIGNS: ED Triage Vitals [07/09/16 0150]  Enc Vitals Group     BP (!) 167/76     Pulse Rate 90     Resp 20     Temp 98.5 F (36.9 C)     Temp Source Oral     SpO2 96 %     Weight 270 lb (122.5 kg)     Height 5\' 11"  (1.803 m)      Head Circumference      Peak Flow      Pain Score 1     Pain Loc      Pain Edu?      Excl. in GC?    Constitutional: Alert and oriented. Well appearing and in no acute distress. Eyes: Conjunctivae are normal. PERRL. EOMI. Head: Atraumatic. Nose: No congestion/rhinnorhea. Mouth/Throat: Mucous membranes are moist.  Oropharynx non-erythematous. Neck: No stridor.  Cardiovascular: Normal rate, regular rhythm. Grossly normal heart sounds.  Good peripheral circulation. Respiratory: Normal respiratory effort.  No retractions. Lungs CTAB. Gastrointestinal: Soft and nontender. No distention. No abdominal bruits. No CVA tenderness. Musculoskeletal: No lower extremity tenderness nor edema.  No joint effusions. Neurologic:  Normal speech and language. No gross focal neurologic deficits are appreciated. No gait instability. Skin:  Skin is warm, dry and intact. No rash noted.   ____________________________________________   LABS (all labs ordered are listed, but only abnormal results are displayed)  Labs Reviewed  BASIC METABOLIC PANEL - Abnormal; Notable for the following:       Result Value   Potassium 3.4 (*)    Chloride 99 (*)    Glucose, Bld 171 (*)    All other components within normal limits  CBC  TROPONIN I   ____________________________________________  EKG  EKG read and interpreted by me shows normal sinus rhythm rate of 77 normal axis compared to the last 2 EKGs he now has flipped T waves in V1 through V4 which were not present before ____________________________________________  RADIOLOGY  Pending ____________________________________________   PROCEDURES  Procedure(s) performed:   Procedures  Critical Care performed:  ____________________________________________   INITIAL IMPRESSION / ASSESSMENT AND PLAN / ED COURSE  Pertinent labs & imaging results that were available during my care of the patient were reviewed by me and considered in my medical decision  making (see chart for details).  Since the patient has chest tightness shortness of breath and lightheadedness with exertion and his EKG is now changed from previously we will put him in the hospital and attempt to elucidate the causes of his symptoms. ____________________________________________   FINAL CLINICAL IMPRESSION(S) / ED DIAGNOSES  Final diagnoses:  Atypical chest pain  Abnormal EKG      NEW MEDICATIONS STARTED DURING THIS VISIT:  New Prescriptions   No medications on file     Note:  This document was prepared using Dragon voice recognition software and may include unintentional dictation errors.    Arnaldo NatalPaul F Malinda, MD 07/09/16 [phone]

## 2016-07-18 ENCOUNTER — Other Ambulatory Visit: Payer: BLUE CROSS/BLUE SHIELD

## 2016-07-19 ENCOUNTER — Encounter: Payer: Self-pay | Admitting: *Deleted

## 2016-07-19 ENCOUNTER — Ambulatory Visit: Payer: BLUE CROSS/BLUE SHIELD | Admitting: Cardiology

## 2016-07-28 ENCOUNTER — Emergency Department
Admission: EM | Admit: 2016-07-28 | Discharge: 2016-07-28 | Disposition: A | Discharge status: Home / Self Care | Payer: BLUE CROSS/BLUE SHIELD | Attending: Emergency Medicine | Admitting: Emergency Medicine

## 2016-07-28 ENCOUNTER — Encounter: Payer: Self-pay | Admitting: Emergency Medicine

## 2016-07-28 DIAGNOSIS — Z87891 Personal history of nicotine dependence: Secondary | ICD-10-CM | POA: Diagnosis not present

## 2016-07-28 DIAGNOSIS — I10 Essential (primary) hypertension: Secondary | ICD-10-CM | POA: Diagnosis not present

## 2016-07-28 DIAGNOSIS — Z79899 Other long term (current) drug therapy: Secondary | ICD-10-CM | POA: Diagnosis not present

## 2016-07-28 DIAGNOSIS — R42 Dizziness and giddiness: Secondary | ICD-10-CM | POA: Diagnosis present

## 2016-07-28 LAB — BASIC METABOLIC PANEL
Anion gap: 7 (ref 5–15)
BUN: 12 mg/dL (ref 6–20)
CHLORIDE: 99 mmol/L — AB (ref 101–111)
CO2: 31 mmol/L (ref 22–32)
Calcium: 9.3 mg/dL (ref 8.9–10.3)
Creatinine, Ser: 0.91 mg/dL (ref 0.61–1.24)
GFR calc Af Amer: >60 mL/min (ref >60)
GFR calc non Af Amer: >60 mL/min (ref >60)
Glucose, Bld: 190 mg/dL — ABNORMAL HIGH (ref 65–99)
POTASSIUM: 3.5 mmol/L (ref 3.5–5.1)
SODIUM: 137 mmol/L (ref 135–145)

## 2016-07-28 LAB — CBC
HEMATOCRIT: 48.5 % (ref 40.0–52.0)
HEMOGLOBIN: 16.4 g/dL (ref 13.0–18.0)
MCH: 29.6 pg (ref 26.0–34.0)
MCHC: 33.7 g/dL (ref 32.0–36.0)
MCV: 87.8 fL (ref 80.0–100.0)
Platelets: 217 10*3/uL (ref 150–440)
RBC: 5.53 MIL/uL (ref 4.40–5.90)
RDW: 14.3 % (ref 11.5–14.5)
WBC: 9 10*3/uL (ref 3.8–10.6)

## 2016-07-28 LAB — TROPONIN I: Troponin I: <0.03 ng/mL (ref <0.03)

## 2016-07-28 LAB — TSH: TSH: 1.152 u[IU]/mL (ref 0.350–4.500)

## 2016-07-28 NOTE — ED Triage Notes (Addendum)
Pt to triage via w/c with no distress noted; reports since 630pm having dizziness, "not feeling right, heart racing"; st has been seen here 3-4 times for same with no definitive diagnosis; pt has been taking amlodipine x 2wks and is concerned that it could possibly be side effects from medication

## 2016-07-28 NOTE — ED Provider Notes (Signed)
[facility] Emergency Department Provider Note   First MD Initiated Contact with Patient 07/28/16 [phone]     (approximate)  I have reviewed the triage vital signs and the nursing notes.   HISTORY  Chief Complaint Dizziness    HPI Ronald Arias is a 35 y.o. male presents with intermittent dizziness which usually occurs after he takes amlodipine. Patient states that his symptoms of been going on for approximately one month after he was prescribed amlodipine emergency Department secondary to hypertension. Patient states that he is unsure if he does have hypertension as he gets "panic attacks and was experiencing a panic attack at that time that he was diagnosed. Patient denies any headache no chest pain or palpitation. Patient denies any dizziness at this time.   Past Medical History:  Diagnosis Date  . Hypertension     Patient Active Problem List   Diagnosis Date Noted  . Dizziness and giddiness 07/09/2016  . Vitamin D deficiency 03/05/2015  . Obesity (BMI 30-39.9) 03/04/2015  . Smoker 03/04/2015  . Prediabetes 03/04/2015  . Hx of opioid abuse 03/04/2015  . FH: diabetes mellitus 03/04/2015  . FH: Crohn's disease 03/04/2015    Past Surgical History:  Procedure Laterality Date  . none      Prior to Admission medications   Medication Sig Start Date End Date Taking? Authorizing Provider  albuterol (PROVENTIL HFA;VENTOLIN HFA) 108 (90 Base) MCG/ACT inhaler Inhale 2 puffs into the lungs every 6 (six) hours as needed for wheezing or shortness of breath. 06/27/16  Yes Jeanmarie PlantJames A McShane, MD  amLODipine (NORVASC) 10 MG tablet Take 1 tablet (10 mg total) by mouth daily. 07/09/16  Yes Shaune PollackQing Chen, MD    Allergies Patient has no known allergies.  Family History  Problem Relation Age of Onset  . Cancer Mother     breast  . Diabetes Father   . Hypertension Father   . Crohn's disease Brother   . Drug abuse Brother     Social History Social History    Substance Use Topics  . Smoking status: Former Smoker    Packs/day: 0.50    Types: Cigarettes    Quit date: 06/07/2016  . Smokeless tobacco: Never Used     Comment: E-cigarettes  . Alcohol use Yes     Comment: 12 pack of beer per week.     Review of Systems Constitutional: No fever/chills Eyes: No visual changes. ENT: No sore throat. Cardiovascular: Denies chest pain. Respiratory: Denies shortness of breath. Gastrointestinal: No abdominal pain.  No nausea, no vomiting.  No diarrhea.  No constipation. Genitourinary: Negative for dysuria. Musculoskeletal: Negative for back pain. Skin: Negative for rash. Neurological: Negative for headaches, focal weakness or numbness. Positive for dizziness  10-point ROS otherwise negative.  ____________________________________________   PHYSICAL EXAM:  VITAL SIGNS: ED Triage Vitals  Enc Vitals Group     BP 07/28/16 0248 (!) 170/89     Pulse Rate 07/28/16 0248 (!) 112     Resp 07/28/16 0248 18     Temp 07/28/16 0248 97.9 F (36.6 C)     Temp Source 07/28/16 0248 Oral     SpO2 07/28/16 0248 95 %     Weight 07/28/16 0247 271 lb (122.9 kg)     Height 07/28/16 0247 5\' 11"  (1.803 m)     Head Circumference --      Peak Flow --      Pain Score 07/28/16 0417 0     Pain Loc --  Pain Edu? --      Excl. in GC? --     Constitutional: Alert and oriented. Well appearing and in no acute distress. Eyes: Conjunctivae are normal. PERRL. EOMI. Head: Atraumatic. Ears:  Healthy appearing ear canals and TMs bilaterally Nose: No congestion/rhinnorhea. Mouth/Throat: Mucous membranes are moist.  Oropharynx non-erythematous. Neck: No stridor.  No meningeal signs.  No cervical spine tenderness to palpation. Cardiovascular: Normal rate, regular rhythm. Good peripheral circulation. Grossly normal heart sounds. Respiratory: Normal respiratory effort.  No retractions. Lungs CTAB. Gastrointestinal: Soft and nontender. No distention.  Musculoskeletal:  No lower extremity tenderness nor edema. No gross deformities of extremities. Neurologic:  Normal speech and language. No gross focal neurologic deficits are appreciated.  Skin:  Skin is warm, dry and intact. No rash noted. Psychiatric: Mood and affect are normal. Speech and behavior are normal.  ____________________________________________   LABS (all labs ordered are listed, but only abnormal results are displayed)  Labs Reviewed  BASIC METABOLIC PANEL - Abnormal; Notable for the following:       Result Value   Chloride 99 (*)    Glucose, Bld 190 (*)    All other components within normal limits  CBC  TROPONIN I  TSH   ____________________________________________  EKG  ED ECG REPORT I, [place] N Maisyn Nouri, the attending physician, personally viewed and interpreted this ECG.   Date: 07/28/2016  EKG Time: 50 5 AM  Rate: 106  Rhythm: Sinus tachycardia  Axis: Normal  Intervals: Normal  ST&T Change: None     Procedures     INITIAL IMPRESSION / ASSESSMENT AND PLAN / ED COURSE  Pertinent labs & imaging results that were available during my care of the patient were reviewed by me and considered in my medical decision making (see chart for details).  History of physical exam concerning for possible medication adverse reaction secondary to amlodipine. Patient will be referred to primary care provider for appropriate outpatient evaluation for hypertension.   Clinical Course     ____________________________________________  FINAL CLINICAL IMPRESSION(S) / ED DIAGNOSES  Final diagnoses:  Dizziness     MEDICATIONS GIVEN DURING THIS VISIT:  Medications - No data to display   NEW OUTPATIENT MEDICATIONS STARTED DURING THIS VISIT:  New Prescriptions   No medications on file    Modified Medications   No medications on file    Discontinued Medications   No medications on file     Note:  This document was prepared using Dragon voice recognition software and  may include unintentional dictation errors.    Darci Currentandolph N Adeja Sarratt, MD 07/28/16 [phone]

## 2016-07-28 NOTE — ED Notes (Signed)
Pt reports intermittent dizziness over the past few weeks, each episode lasting up to 12 hours.  Pt reports has been seen in ED for same.  Pt denies CP and HA.  Pt NAD at this time, resp equal and unlabored, skin warm and dry

## 2016-09-09 ENCOUNTER — Emergency Department
Admission: EM | Admit: 2016-09-09 | Discharge: 2016-09-09 | Disposition: A | Discharge status: Home / Self Care | Payer: BLUE CROSS/BLUE SHIELD | Attending: Emergency Medicine | Admitting: Emergency Medicine

## 2016-09-09 ENCOUNTER — Emergency Department: Payer: BLUE CROSS/BLUE SHIELD

## 2016-09-09 DIAGNOSIS — I10 Essential (primary) hypertension: Secondary | ICD-10-CM | POA: Diagnosis not present

## 2016-09-09 DIAGNOSIS — R0789 Other chest pain: Secondary | ICD-10-CM | POA: Diagnosis not present

## 2016-09-09 DIAGNOSIS — Z87891 Personal history of nicotine dependence: Secondary | ICD-10-CM | POA: Diagnosis not present

## 2016-09-09 DIAGNOSIS — Z79899 Other long term (current) drug therapy: Secondary | ICD-10-CM | POA: Insufficient documentation

## 2016-09-09 DIAGNOSIS — R079 Chest pain, unspecified: Secondary | ICD-10-CM

## 2016-09-09 LAB — BRAIN NATRIURETIC PEPTIDE: B NATRIURETIC PEPTIDE 5: 38 pg/mL (ref 0.0–100.0)

## 2016-09-09 LAB — BASIC METABOLIC PANEL
Anion gap: 7 (ref 5–15)
BUN: 18 mg/dL (ref 6–20)
CO2: 25 mmol/L (ref 22–32)
CREATININE: 0.8 mg/dL (ref 0.61–1.24)
Calcium: 9.3 mg/dL (ref 8.9–10.3)
Chloride: 102 mmol/L (ref 101–111)
GFR calc Af Amer: >60 mL/min (ref >60)
GLUCOSE: 132 mg/dL — AB (ref 65–99)
POTASSIUM: 4 mmol/L (ref 3.5–5.1)
Sodium: 134 mmol/L — ABNORMAL LOW (ref 135–145)

## 2016-09-09 LAB — CBC
HEMATOCRIT: 49 % (ref 40.0–52.0)
Hemoglobin: 17 g/dL (ref 13.0–18.0)
MCH: 30.2 pg (ref 26.0–34.0)
MCHC: 34.7 g/dL (ref 32.0–36.0)
MCV: 87 fL (ref 80.0–100.0)
Platelets: 216 10*3/uL (ref 150–440)
RBC: 5.63 MIL/uL (ref 4.40–5.90)
RDW: 14.2 % (ref 11.5–14.5)
WBC: 9.5 10*3/uL (ref 3.8–10.6)

## 2016-09-09 LAB — TROPONIN I: Troponin I: <0.03 ng/mL (ref <0.03)

## 2016-09-09 MED ORDER — GUAIFENESIN-CODEINE 100-10 MG/5ML PO SOLN: 10.0000 mL | Freq: Three times a day (TID) | ORAL | 0 refills | Status: DC | PRN

## 2016-09-09 NOTE — ED Provider Notes (Signed)
[facility] Emergency Department Provider Note  ____________________________________________   First MD Initiated Contact with Patient 09/09/16 1815     (approximate)  I have reviewed the triage vital signs and the nursing notes.   HISTORY  Chief Complaint Chest Pain  HPI Ronald Arias is a 35 y.o. male who presents to the ER for chest pressure that has been intermittent since September. He states that it had completely resolved for approximately one month until3 days ago. He states that he feels a "heaviness" around the anterior portion of his chest wall. He reports intermittent cough but states that he recently quit smoking and feels that it is his body's way of clearing the years of smoking out of his chest. He denies significant shortness of breath. He denies any recent weight loss but states that he has gained some weight recently. He does state that over Christmas, he had some lower extremity swelling but that has completely resolved. He does report a history of opioid abuse, but states that has been many years ago and denies any recent substance abuse of any kind with the exception of occasional alcohol.   Past Medical History:  Diagnosis Date  . Hypertension     Patient Active Problem List   Diagnosis Date Noted  . Dizziness and giddiness 07/09/2016  . Vitamin D deficiency 03/05/2015  . Obesity (BMI 30-39.9) 03/04/2015  . Smoker 03/04/2015  . Prediabetes 03/04/2015  . Hx of opioid abuse 03/04/2015  . FH: diabetes mellitus 03/04/2015  . FH: Crohn's disease 03/04/2015    Past Surgical History:  Procedure Laterality Date  . none      Prior to Admission medications   Medication Sig Start Date End Date Taking? Authorizing Provider  albuterol (PROVENTIL HFA;VENTOLIN HFA) 108 (90 Base) MCG/ACT inhaler Inhale 2 puffs into the lungs every 6 (six) hours as needed for wheezing or shortness of breath. 06/27/16   Jeanmarie PlantJames A McShane, MD  amLODipine  (NORVASC) 10 MG tablet Take 1 tablet (10 mg total) by mouth daily. 07/09/16   Shaune PollackQing Chen, MD  guaiFENesin-codeine 100-10 MG/5ML syrup Take 10 mLs by mouth 3 (three) times daily as needed. 09/09/16   Chinita Pesterari B Kimblery Diop, FNP    Allergies Patient has no known allergies.  Family History  Problem Relation Age of Onset  . Cancer Mother     breast  . Diabetes Father   . Hypertension Father   . Crohn's disease Brother   . Drug abuse Brother     Social History Social History  Substance Use Topics  . Smoking status: Former Smoker    Packs/day: 0.50    Types: Cigarettes    Quit date: 06/07/2016  . Smokeless tobacco: Never Used     Comment: E-cigarettes  . Alcohol use Yes     Comment: 12 pack of beer per week.     Review of Systems Constitutional: No fever/chills Eyes: No visual changes. ENT: No sore throat. Cardiovascular: Positive for chest pain. Respiratory: Denies shortness of breath. Gastrointestinal: No abdominal pain.  No nausea, no vomiting.  No diarrhea.  No constipation. Genitourinary: Negative for dysuria. Musculoskeletal: Negative for back pain. Skin: Negative for rash. Neurological: Negative for headaches, focal weakness or numbness.  10-point ROS otherwise negative.  ____________________________________________   PHYSICAL EXAM:  VITAL SIGNS: ED Triage Vitals  Enc Vitals Group     BP 09/09/16 1728 (!) 142/69     Pulse Rate 09/09/16 1728 88     Resp 09/09/16 1728 20  Temp 09/09/16 1728 98.1 F (36.7 C)     Temp Source 09/09/16 1728 Oral     SpO2 09/09/16 1728 97 %     Weight 09/09/16 1729 266 lb (120.7 kg)     Height 09/09/16 1729 5\' 11"  (1.803 m)     Head Circumference --      Peak Flow --      Pain Score 09/09/16 1729 7     Pain Loc --      Pain Edu? --      Excl. in GC? --     Constitutional: Alert and oriented. Well appearing and in no acute distress. Eyes: Conjunctivae are normal. PERRL. EOMI. Head: Atraumatic. Nose: No  congestion/rhinnorhea. Mouth/Throat: Mucous membranes are moist.  Oropharynx non-erythematous. Neck: No stridor.  No obvious JVD Cardiovascular: Normal rate, regular rhythm. Grossly normal heart sounds.  Good peripheral circulation. No S3 auscultated Respiratory: Normal respiratory effort.  No retractions. Lungs CTAB. Gastrointestinal: Soft and nontender. No distention. No abdominal bruits. No CVA tenderness. Musculoskeletal: No lower extremity tenderness nor edema.  No joint effusions. Neurologic:  Normal speech and language. No gross focal neurologic deficits are appreciated. No gait instability. Skin:  Skin is warm, dry and intact. No rash noted. Psychiatric: Mood and affect are normal. Speech and behavior are normal.  ____________________________________________   LABS (all labs ordered are listed, but only abnormal results are displayed)  Labs Reviewed  BASIC METABOLIC PANEL - Abnormal; Notable for the following:       Result Value   Sodium 134 (*)    Glucose, Bld 132 (*)    All other components within normal limits  CBC  TROPONIN I  BRAIN NATRIURETIC PEPTIDE   ____________________________________________  EKG  See attending note ____________________________________________  RADIOLOGY  Borderline cardiomegaly with some interval slight increase in interstitial prominence which could possibly reflect mild interstitial edema. Chronic bilateral pleural thickening in right upper lobe subsegmental atelectasis/scarring. ____________________________________________   PROCEDURES  Procedure(s) performed: None  Procedures  Critical Care performed: No  ____________________________________________   INITIAL IMPRESSION / ASSESSMENT AND PLAN / ED COURSE  35 year old male presenting to the emergency department for chest wall pressure. He was admitted late in October for chest pain and had an echocardiogram at that time that was normal with an ejection fraction of 65-70%. He  currently denies orthopnea or significant dyspnea on exertion. He does state that he has had some chest congestion but relates that to smoking cessation. Today, symptoms are concerning for an early heart failure, however he does not meet matrix for diagnosis based on Modified Framingham Criteria. Chest x-ray from October does not indicate interstitial edema, however does show borderline cardiomegaly. Today, there is an interval increase in the interstitial prominence. EKG is normal without any Q waves or evidence of ectopy. BNP is 38.0. Patient will be discharged home to follow up with cardiology and primary care. He will be given a prescription for guaifenesin for his cough.   Pertinent labs & imaging results that were available during my care of the patient were reviewed by me and considered in my medical decision making (see chart for details).   Clinical Course   _________________________________________   FINAL CLINICAL IMPRESSION(S) / ED DIAGNOSES  Final diagnoses:  Nonspecific chest pain      NEW MEDICATIONS STARTED DURING THIS VISIT:  New Prescriptions   GUAIFENESIN-CODEINE 100-10 MG/5ML SYRUP    Take 10 mLs by mouth 3 (three) times daily as needed.     Note:  This  document was prepared using Conservation officer, historic buildingsDragon voice recognition software and may include unintentional dictation errors.    Chinita PesterCari B Shawntrice Salle, FNP 09/10/16 2030    Minna AntisKevin Paduchowski, MD 09/13/16 2238

## 2016-09-09 NOTE — ED Notes (Signed)
See triage note  States some chest discomfort and tightness which started over the past few days   No resp distress noted on arrival  resp even and non labored  Afebrile on arrival

## 2016-09-09 NOTE — Discharge Instructions (Signed)
Please call and schedule an appointment with cardiology for follow-up. Take the cough medicine with the expectorant as prescribed. If you're chest pain changes or worsens, return to the emergency department.

## 2016-09-09 NOTE — ED Provider Notes (Signed)
-----------------------------------------   8:35 PM on 09/09/2016 -----------------------------------------  EKG reviewed and interpreted by myself shows normal sinus rhythm at 95 bpm, narrow QRS, normal axis, normal intervals, nonspecific but no concerning ST changes.   Minna AntisKevin Oluwateniola Leitch, MD 09/09/16 2035

## 2016-09-09 NOTE — ED Triage Notes (Signed)
Pt states that he has had some tightness with congestion in his chest for the past few days, pt states that it feels like there is a pressure in his chest pushing down, no distress noted at this time

## 2016-09-13 ENCOUNTER — Telehealth: Payer: Self-pay | Admitting: Cardiology

## 2016-09-13 NOTE — Telephone Encounter (Signed)
Lmov for patient tp call back and schedule ED fu appointment with Dr Alvino ChapelIngal Pt was seen for CP on 09/09/16 Will try again at later time

## 2016-11-24 ENCOUNTER — Other Ambulatory Visit: Payer: Self-pay

## 2016-11-24 NOTE — Telephone Encounter (Signed)
Can you Please advise. Medication was not prescribed by Dr. Alvino ChapelIngal.

## 2016-11-28 ENCOUNTER — Other Ambulatory Visit: Payer: Self-pay

## 2016-11-28 NOTE — Telephone Encounter (Signed)
Pt is completely out

## 2016-11-29 MED ORDER — AMLODIPINE BESYLATE 10 MG PO TABS: 10.0000 mg | ORAL_TABLET | Freq: Every day | ORAL | 0 refills | Status: DC

## 2016-11-29 NOTE — Telephone Encounter (Signed)
Patient needs follow up appointment for any further refills

## 2016-12-29 ENCOUNTER — Other Ambulatory Visit: Payer: Self-pay

## 2016-12-29 NOTE — Telephone Encounter (Signed)
Please advise if ok to refill Amlodipine, Pt due for appointment.

## 2017-01-12 ENCOUNTER — Ambulatory Visit: Payer: BLUE CROSS/BLUE SHIELD | Admitting: Cardiology

## 2017-01-16 ENCOUNTER — Other Ambulatory Visit: Payer: Self-pay | Admitting: Cardiology

## 2017-01-16 NOTE — Telephone Encounter (Signed)
Will you please advise to make sure Dr. Alvino ChapelIngal is okay with refilling this medication, Thanks!

## 2017-01-17 ENCOUNTER — Other Ambulatory Visit: Payer: Self-pay | Admitting: Cardiology

## 2017-01-17 ENCOUNTER — Telehealth: Payer: Self-pay | Admitting: *Deleted

## 2017-01-17 NOTE — Telephone Encounter (Signed)
Patient needs follow up with Dr. Alvino ChapelIngal before any further refills. Last seen 06/14/16 and he was to follow up in 1 month.

## 2017-01-17 NOTE — Telephone Encounter (Signed)
Pt requesting refill Amlodipine 10 mg tablet, Please advise if ok to refill?

## 2017-01-17 NOTE — Telephone Encounter (Signed)
Ok. Pt has upcoming appointment with Ingal.

## 2017-01-18 ENCOUNTER — Ambulatory Visit (INDEPENDENT_AMBULATORY_CARE_PROVIDER_SITE_OTHER): Payer: BLUE CROSS/BLUE SHIELD | Admitting: Cardiology

## 2017-01-18 ENCOUNTER — Encounter: Payer: Self-pay | Admitting: Cardiology

## 2017-01-18 VITALS — BP 170/90 | HR 87 | Ht 71.0 in | Wt 271.5 lb

## 2017-01-18 DIAGNOSIS — R079 Chest pain, unspecified: Secondary | ICD-10-CM

## 2017-01-18 DIAGNOSIS — I10 Essential (primary) hypertension: Secondary | ICD-10-CM | POA: Diagnosis not present

## 2017-01-18 MED ORDER — LISINOPRIL 10 MG PO TABS: 10.0000 mg | ORAL_TABLET | Freq: Every day | ORAL | 2 refills | Status: DC

## 2017-01-18 MED ORDER — AMLODIPINE BESYLATE 10 MG PO TABS: 10.0000 mg | ORAL_TABLET | Freq: Every day | ORAL | 2 refills | Status: DC

## 2017-01-18 NOTE — Telephone Encounter (Signed)
Patient needs follow up before refills. He is scheduled to come in today to see Dr. Alvino ChapelIngal 01/18/17. Thanks

## 2017-01-18 NOTE — Telephone Encounter (Signed)
Please review for refill. Thanks!  

## 2017-01-18 NOTE — Progress Notes (Signed)
Cardiology Office Note   Date:  01/18/2017   ID:  Ronald Arias, DOB [birth year], MRN [id number]  Referring Doctor:  Patient, No Pcp Per   Cardiologist:   Almond LintAileen Sheronda Parran, MD   Reason for consultation:  Chief Complaint  Patient presents with  . other    Patient was Last seen 06/2016.  Patient c/o chest discomfort. Meds reviewed verbally with patient.       History of Present Illness: Ronald Arias is a 36 y.o. male who presents for Follow-up for hypertension  Review of records show:Presented to the ER 9 30 2017 with symptoms of chest discomfort, shortness of breath, dizziness. Blood pressure at home was 220/90. He was asked to follow-up in our office for further evaluation. Also mentions chest discomfort, usually when his blood pressure is elevated or when he gets emotionally upset. Chest pressure is in the center of the chest, nonradiating, mild to moderate in intensity, lasting several minutes at a time, resolving spontaneously.  Patient was last seen October and was unable to follow-up post one month. He continues to have problems of blood pressure, systolics usually in the 140s to 170s. Overall, better than 200 systolics. He continues to have chest discomfort. This is across the chest and upper abdomen, nonradiating, mild to moderate in intensity, almost constantly, worse with exertion, worse when the blood pressure is high.   ROS:  Please see the history of present illness. Aside from mentioned under HPI, all other systems are reviewed and negative.    Past Medical History:  Diagnosis Date  . Hypertension     Past Surgical History:  Procedure Laterality Date  . none       reports that he quit smoking about 7 months ago. His smoking use included Cigarettes. He smoked 0.50 packs per day. He has never used smokeless tobacco. He reports that he drinks alcohol. He reports that he does not use drugs.   family history includes Cancer in his mother; Crohn's disease in  his brother; Diabetes in his father; Drug abuse in his brother; Hypertension in his father.   Outpatient Medications Prior to Visit  Medication Sig Dispense Refill  . albuterol (PROVENTIL HFA;VENTOLIN HFA) 108 (90 Base) MCG/ACT inhaler Inhale 2 puffs into the lungs every 6 (six) hours as needed for wheezing or shortness of breath. 1 Inhaler 2  . amLODipine (NORVASC) 10 MG tablet Take 1 tablet (10 mg total) by mouth daily. 30 tablet 0  . guaiFENesin-codeine 100-10 MG/5ML syrup Take 10 mLs by mouth 3 (three) times daily as needed. (Patient not taking: Reported on 01/18/2017) 120 mL 0   No facility-administered medications prior to visit.      Allergies: Patient has no known allergies.    PHYSICAL EXAM: VS:  BP (!) 170/90 (BP Location: Left Arm, Patient Position: Sitting, Cuff Size: Normal)   Pulse 87   Ht 5\' 11"  (1.803 m)   Wt 271 lb 8 oz (123.2 kg)   BMI 37.87 kg/m  , Body mass index is 37.87 kg/m. Wt Readings from Last 3 Encounters:  01/18/17 271 lb 8 oz (123.2 kg)  09/09/16 266 lb (120.7 kg)  07/28/16 271 lb (122.9 kg)    GENERAL:  well developed, well nourished, obese, not in acute distress HEENT: normocephalic, pink conjunctivae, anicteric sclerae, no xanthelasma, normal dentition, oropharynx clear NECK:  no neck vein engorgement, JVP normal, no hepatojugular reflux, carotid upstroke brisk and symmetric, no bruit, no thyromegaly, no lymphadenopathy LUNGS:  good respiratory effort, clear to  auscultation bilaterally CV:  PMI not displaced, no thrills, no lifts, S1 and S2 within normal limits, no palpable S3 or S4, no murmurs, no rubs, no gallops ABD:  Soft, nontender, nondistended, normoactive bowel sounds, no abdominal aortic bruit, no hepatomegaly, no splenomegaly MS: nontender back, no kyphosis, no scoliosis, no joint deformities EXT:  2+ DP/PT pulses, no edema, no varicosities, no cyanosis, no clubbing SKIN: warm, nondiaphoretic, normal turgor, no ulcers NEUROPSYCH: alert,  oriented to person, place, and time, sensory/motor grossly intact, normal mood, appropriate affect    Recent Labs: 06/11/2016: ALT 35 07/28/2016: TSH 1.152 09/09/2016: B Natriuretic Peptide 38.0; BUN 18; Creatinine, Ser 0.80; Hemoglobin 17.0; Platelets 216; Potassium 4.0; Sodium 134   Lipid Panel    Component Value Date/Time   CHOL 176 07/09/2016 0620   CHOL 209 (H) 03/04/2015 1540   TRIG 50 07/09/2016 0620   HDL 82 07/09/2016 0620   HDL 60 03/04/2015 1540   CHOLHDL 2.1 07/09/2016 0620   VLDL 10 07/09/2016 0620   LDLCALC 84 07/09/2016 0620   LDLCALC 133 (H) 03/04/2015 1540     Other studies Reviewed:  EKG:  The ekg from 06/14/2016 was personally reviewed by me and it revealed sinus rhythm, 94 BPM, nonspecific T-wave changes.  Additional studies/ records that were reviewed personally reviewed by me today include:  Echo 07/09/2016: Left ventricle: The cavity size was normal. Wall thickness was   normal. Systolic function was vigorous. The estimated ejection   fraction was in the range of 65% to 70%. Wall motion was normal;   there were no regional wall motion abnormalities.  Impressions:  - Normal study. Normal LVF   Normal Wall Motion   EF=65-70%   Normal right side.   ASSESSMENT AND PLAN:  Hypertension, still uncontrolled Recommend to continue amlodipine 10 mg by mouth daily. Check BMP today. Start lisinopril 10 mg by mouth daily. Recheck BMP in 1 week. We'll need to workup for secondary causes of hypertension: Check plasma metanephrines Check PA-C/PRA Check renal artery ultrasound Continue lifestyle changes including low sodium diet.  Chest pain Persist despite some improvement in blood pressure Recommend to rule out ischemia with stress echocardiogram. May do trial with over-the-counter antacids.  Tobacco use We discussed the importance of smoking cessation and different strategies for quitting. He has stopped tobacco use but continues to  vape.  Current medicines are reviewed at length with the patient today.  The patient does not have concerns regarding medicines.  Labs/ tests ordered today include:  Orders Placed This Encounter  Procedures  . Basic metabolic panel  . Basic metabolic panel  . Metanephrines, plasma  . TSH  . T4, free  . Aldosterone + renin activity w/ ratio  . EKG 12-Lead  . ECHOCARDIOGRAM COMPLETE    I had a lengthy and detailed discussion with the patient regarding diagnoses, prognosis, diagnostic options, treatment options, and side effects of medications.   I counseled the patient on importance of lifestyle modification including heart healthy diet, regular physical activity , and smoking cessation.   Disposition:   FU withCardiology after tests   Signed, Almond Lint, MD  01/18/2017 5:10 PM    [facility]  This note was generated in part with voice recognition software and I apologize for any typographical errors that were not detected and corrected.

## 2017-01-18 NOTE — Patient Instructions (Addendum)
Medication Instructions:  Your physician has recommended you make the following change in your medication:  1. START Lisonopril 10 mg once daily 2. CONTINUE Amlodipine 10 mg once daily   Labwork: Your physician recommends that you return for lab work in: 1 week after medication changes. Go to [facility] of the hospital and check in at the front desk.    Testing/Procedures: Your physician has requested that you have a renal artery duplex. During this test, an ultrasound is used to evaluate blood flow to the kidneys. Allow one hour for this exam. Do not eat after midnight the day before and avoid carbonated beverages. Take your medications as you usually do.  Your physician has requested that you have a stress echocardiogram. For further information please visit [email or web address] Please follow instruction sheet as given.   Do not drink or eat foods with caffeine for 24 hours before the test. (Chocolate, coffee, tea, or energy drinks)  If you use an inhaler, bring it with you to the test.  Do not smoke for 4 hours before the test.  Wear comfortable shoes and clothing.  Follow-Up: Your physician recommends that you schedule a follow-up appointment after testing.   It was a pleasure seeing you today here in the office. Please do not hesitate to give [place] a call back if you have any further questions. [phone]  Stites Cellar RN, BSN     Exercise Stress Echocardiogram An exercise stress echocardiogram is a test that checks how well your heart is working. For this test, you will walk on a treadmill to make your heart beat faster. This test uses sound waves (ultrasound) and a computer to make pictures (images) of your heart. These pictures will be taken before you exercise and after you exercise. What happens before the procedure?  Follow instructions from your doctor about what you cannot eat or drink before the test.  Do not drink or eat anything that has caffeine in it.  Stop having caffeine for 24 hours before the test.  Ask your doctor about changing or stopping your normal medicines. This is important if you take diabetes medicines or blood thinners. Ask your doctor if you should take your medicines with water before the test.  If you use an inhaler, bring it to the test.  Do not use any products that have nicotine or tobacco in them, such as cigarettes and e-cigarettes. Stop using them for 4 hours before the test. If you need help quitting, ask your doctor.  Wear comfortable shoes and clothing. What happens during the procedure?  You will be hooked up to a TV screen. Your doctor will watch the screen to see how fast your heart beats during the test.  Before you exercise, a computer will make a picture of your heart. To do this:  A gel will be put on your chest.  A wand will be moved over the gel.  Sound waves from the wand will go to the computer to make the picture.  Your will start walking on a treadmill. The treadmill will start at a slow speed. It will get faster a little bit at a time. When you walk faster, your heart will beat faster.  The treadmill will be stopped when your heart is working hard.  You will lie down right away so another picture of your heart can be taken.  The test will take 30-60 minutes. What happens after the procedure?  Your heart rate and blood pressure will be watched  after the test.  If your doctor says that you can, you may:  Eat what you usually eat.  Do your normal activities.  Take medicines like normal. Summary  An exercise stress echocardiogram is a test that checks how well your heart is working.  Follow instructions about what you cannot eat or drink before the test. Ask your doctor if you should take your normal medicines before the test.  Stop having caffeine for 24 hours before the test. Do not use anything with nicotine or tobacco in it for 4 hours before the test.  A computer will take a  picture of your heart before you walk on a treadmill. It will take another picture when you are done walking.  Your heart rate and blood pressure will be watched after the test. This information is not intended to replace advice given to you by your health care provider. Make sure you discuss any questions you have with your health care provider. Document Released: 06/26/2009 Document Revised: 05/22/2016 Document Reviewed: 05/22/2016 Elsevier Interactive Patient Education  2017 [employer]

## 2017-06-14 ENCOUNTER — Telehealth: Payer: Self-pay | Admitting: Cardiology

## 2017-06-14 NOTE — Telephone Encounter (Signed)
°*  STAT* If patient is at the pharmacy, call can be transferred to refill team.   1. Which medications need to be refilled? (please list name of each medication and dose if known)   Amlodipine 10 mg po daily   Lisinopril 10 mg po daily   2. Which pharmacy/location (including street and city if local pharmacy) is medication to be sent to?\  [facility] [place]   3. Do they need a 30 day or 90 day supply? 90

## 2017-06-15 MED ORDER — LISINOPRIL 10 MG PO TABS: 10.0000 mg | ORAL_TABLET | Freq: Every day | ORAL | 0 refills | Status: DC

## 2017-06-15 MED ORDER — AMLODIPINE BESYLATE 10 MG PO TABS: 10.0000 mg | ORAL_TABLET | Freq: Every day | ORAL | 0 refills | Status: DC

## 2017-07-20 ENCOUNTER — Other Ambulatory Visit: Payer: Self-pay

## 2017-07-20 MED ORDER — LISINOPRIL 10 MG PO TABS: 10.0000 mg | ORAL_TABLET | Freq: Every day | ORAL | 0 refills | Status: DC

## 2017-07-25 ENCOUNTER — Encounter: Payer: Self-pay | Admitting: Cardiovascular Disease

## 2017-07-25 ENCOUNTER — Ambulatory Visit: Payer: BLUE CROSS/BLUE SHIELD | Admitting: Cardiovascular Disease

## 2017-07-25 VITALS — BP 166/60 | HR 92 | Ht 71.0 in | Wt 285.5 lb

## 2017-07-25 DIAGNOSIS — F17200 Nicotine dependence, unspecified, uncomplicated: Secondary | ICD-10-CM | POA: Diagnosis not present

## 2017-07-25 DIAGNOSIS — R42 Dizziness and giddiness: Secondary | ICD-10-CM

## 2017-07-25 DIAGNOSIS — Z23 Encounter for immunization: Secondary | ICD-10-CM

## 2017-07-25 DIAGNOSIS — R7303 Prediabetes: Secondary | ICD-10-CM | POA: Diagnosis not present

## 2017-07-25 DIAGNOSIS — R079 Chest pain, unspecified: Secondary | ICD-10-CM | POA: Diagnosis not present

## 2017-07-25 DIAGNOSIS — E119 Type 2 diabetes mellitus without complications: Secondary | ICD-10-CM | POA: Diagnosis not present

## 2017-07-25 DIAGNOSIS — G8929 Other chronic pain: Secondary | ICD-10-CM | POA: Insufficient documentation

## 2017-07-25 MED ORDER — LISINOPRIL 10 MG PO TABS: 10.0000 mg | ORAL_TABLET | Freq: Every day | ORAL | 4 refills | Status: DC

## 2017-07-25 MED ORDER — AMLODIPINE BESYLATE 10 MG PO TABS: 10.0000 mg | ORAL_TABLET | Freq: Every day | ORAL | 4 refills | Status: DC
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
## 2017-07-25 NOTE — Patient Instructions (Addendum)
Medication Instructions:   Try amlodipine 1/2 pill twice a day Lisinopril in the PM   Labwork:  No new labs needed  Testing/Procedures:  No further testing at this time   Follow-Up: It was a pleasure seeing you in the office today. Please call us if you have new issues that need to be addressed before your next appt.  [phone]  Your physician wants you to follow-up in: 12 months.  You will receive a reminder letter in the mail two months in advance. If you don't receive a letter, please call our office to schedule the follow-up appointment.  If you need a refill on your cardiac medications before your next appointment, please call your pharmacy.

## 2018-02-21 ENCOUNTER — Other Ambulatory Visit: Payer: Self-pay

## 2018-02-21 ENCOUNTER — Ambulatory Visit
Admission: EM | Admit: 2018-02-21 | Discharge: 2018-02-21 | Disposition: A | Discharge status: Home / Self Care | Payer: BLUE CROSS/BLUE SHIELD | Attending: Family Medicine | Admitting: Family Medicine

## 2018-02-21 DIAGNOSIS — J40 Bronchitis, not specified as acute or chronic: Secondary | ICD-10-CM

## 2018-02-21 DIAGNOSIS — R05 Cough: Secondary | ICD-10-CM | POA: Diagnosis not present

## 2018-02-21 DIAGNOSIS — J4521 Mild intermittent asthma with (acute) exacerbation: Secondary | ICD-10-CM

## 2018-02-21 DIAGNOSIS — R062 Wheezing: Secondary | ICD-10-CM

## 2018-02-21 MED ORDER — PREDNISONE 20 MG PO TABS: ORAL_TABLET | ORAL | 0 refills | Status: DC

## 2018-02-21 MED ORDER — DOXYCYCLINE HYCLATE 100 MG PO TABS: 100.0000 mg | ORAL_TABLET | Freq: Two times a day (BID) | ORAL | 0 refills | Status: DC

## 2018-02-21 MED ORDER — ALBUTEROL SULFATE HFA 108 (90 BASE) MCG/ACT IN AERS: 1.0000 | INHALATION_SPRAY | Freq: Four times a day (QID) | RESPIRATORY_TRACT | 0 refills | Status: DC | PRN

## 2018-02-21 MED ORDER — IPRATROPIUM-ALBUTEROL 0.5-2.5 (3) MG/3ML IN SOLN
3.0000 mL | Freq: Once | RESPIRATORY_TRACT | Status: AC
  Administered 2018-02-21: 3 mL via RESPIRATORY_TRACT

## 2018-02-21 NOTE — ED Triage Notes (Signed)
Patient complains of cough, congestion, shortness of breath x yesterday.

## 2018-02-21 NOTE — ED Provider Notes (Signed)
[facility]    CSN: [id number] Arrival date & time: 02/21/18  1659     History   Chief Complaint Chief Complaint  Patient presents with  . Cough    HPI Ronald Arias is a 37 y.o. male.   The history is provided by the patient.  Cough  Associated symptoms: wheezing   URI  Presenting symptoms: cough   Severity:  Moderate Onset quality:  Sudden Timing:  Constant Progression:  Worsening Chronicity:  New Relieved by:  Inhaler Associated symptoms: wheezing   Risk factors: not elderly, no chronic cardiac disease, no chronic kidney disease, no immunosuppression, no recent illness and no recent travel  Chronic respiratory disease: unknown, however patient is former smoker and currently vapes occasionally.     Past Medical History:  Diagnosis Date  . Hypertension     Patient Active Problem List   Diagnosis Date Noted  . Chronic chest pain 07/25/2017  . Type 2 diabetes mellitus without complication, without long-term current use of insulin (HCC) 07/25/2017  . Dizziness and giddiness 07/09/2016  . Vitamin D deficiency 03/05/2015  . Obesity (BMI 30-39.9) 03/04/2015  . Smoker 03/04/2015  . Prediabetes 03/04/2015  . Hx of opioid abuse 03/04/2015  . FH: diabetes mellitus 03/04/2015  . FH: Crohn's disease 03/04/2015  . HTN (hypertension) 02/21/2015    Past Surgical History:  Procedure Laterality Date  . none         Home Medications    Prior to Admission medications   Medication Sig Start Date End Date Taking? Authorizing Provider  amLODipine (NORVASC) 10 MG tablet Take 1 tablet (10 mg total) daily by mouth. 07/25/17  Yes Gollan, Tollie Pizza, MD  lisinopril (PRINIVIL,ZESTRIL) 10 MG tablet Take 1 tablet (10 mg total) daily by mouth. 07/25/17  Yes Gollan, Tollie Pizza, MD  albuterol (PROVENTIL HFA;VENTOLIN HFA) 108 (90 Base) MCG/ACT inhaler Inhale 1-2 puffs into the lungs every 6 (six) hours as needed for wheezing or shortness of breath. 02/21/18   Payton Mccallum, MD  doxycycline (VIBRA-TABS) 100 MG tablet Take 1 tablet (100 mg total) by mouth 2 (two) times daily. 02/21/18   Payton Mccallum, MD  predniSONE (DELTASONE) 20 MG tablet 3 tabs po once day 1, then 2 tabs po qd x 2 days, then 1 tab qd x 2 days, then half a tab po qd x 2 days 02/21/18   Payton Mccallum, MD    Family History Family History  Problem Relation Age of Onset  . Cancer Mother        breast  . Diabetes Father   . Hypertension Father   . Crohn's disease Brother   . Drug abuse Brother     Social History Social History   Tobacco Use  . Smoking status: Former Smoker    Packs/day: 0.50    Types: Cigarettes    Last attempt to quit: 06/07/2016    Years since quitting: 1.7  . Smokeless tobacco: Never Used  . Tobacco comment: E-cigarettes  Substance Use Topics  . Alcohol use: Yes    Comment: 12 pack of beer per week.   . Drug use: No     Allergies   Patient has no known allergies.   Review of Systems Review of Systems  Respiratory: Positive for cough and wheezing.      Physical Exam Triage Vital Signs ED Triage Vitals  Enc Vitals Group     BP 02/21/18 1711 (!) 168/75     Pulse Rate 02/21/18 1711 (!) 110  Resp 02/21/18 1711 19     Temp 02/21/18 1711 98.3 F (36.8 C)     Temp Source 02/21/18 1711 Oral     SpO2 02/21/18 1711 94 %     Weight 02/21/18 1709 300 lb (136.1 kg)     Height 02/21/18 1709 5\' 11"  (1.803 m)     Head Circumference --      Peak Flow --      Pain Score 02/21/18 1709 6     Pain Loc --      Pain Edu? --      Excl. in GC? --    No data found.  Updated Vital Signs BP (!) 168/75 (BP Location: Left Arm)   Pulse (!) 110   Temp 98.3 F (36.8 C) (Oral)   Resp 19   Ht 5\' 11"  (1.803 m)   Wt 300 lb (136.1 kg)   SpO2 94%   BMI 41.84 kg/m   Visual Acuity Right Eye Distance:   Left Eye Distance:   Bilateral Distance:    Right Eye Near:   Left Eye Near:    Bilateral Near:     Physical Exam  Constitutional: He appears  well-developed and well-nourished. No distress.  HENT:  Head: Normocephalic and atraumatic.  Right Ear: Ear canal normal.  Left Ear: Ear canal normal.  Mouth/Throat: Uvula is midline and mucous membranes are normal.  Neck: Normal range of motion. Neck supple. No tracheal deviation present. No thyromegaly present.  Cardiovascular: Normal rate, regular rhythm and normal heart sounds.  Pulmonary/Chest: Effort normal. No stridor. No respiratory distress. He has wheezes (diffusely bilaterally). He has no rales. He exhibits no tenderness.  Diffuse rhonchi  Lymphadenopathy:    He has no cervical adenopathy.  Neurological: He is alert.  Skin: Skin is warm and dry. No rash noted. He is not diaphoretic.  Nursing note and vitals reviewed.    UC Treatments / Results  Labs (all labs ordered are listed, but only abnormal results are displayed) Labs Reviewed - No data to display  EKG None  Radiology No results found.  Procedures Procedures (including critical care time)  Medications Ordered in UC Medications  ipratropium-albuterol (DUONEB) 0.5-2.5 (3) MG/3ML nebulizer solution 3 mL (3 mLs Nebulization Given 02/21/18 1735)  ipratropium-albuterol (DUONEB) 0.5-2.5 (3) MG/3ML nebulizer solution 3 mL (3 mLs Nebulization Given 02/21/18 1756)    Initial Impression / Assessment and Plan / UC Course  I have reviewed the triage vital signs and the nursing notes.  Pertinent labs & imaging results that were available during my care of the patient were reviewed by me and considered in my medical decision making (see chart for details).      Final Clinical Impressions(s) / UC Diagnoses   Final diagnoses:  Mild intermittent reactive airway disease with acute exacerbation  Bronchitis   Discharge Instructions   None    ED Prescriptions    Medication Sig Dispense Auth. Provider   doxycycline (VIBRA-TABS) 100 MG tablet Take 1 tablet (100 mg total) by mouth 2 (two) times daily. 20 tablet Stephan Draughn,  Pamala Hurryrlando, MD   predniSONE (DELTASONE) 20 MG tablet 3 tabs po once day 1, then 2 tabs po qd x 2 days, then 1 tab qd x 2 days, then half a tab po qd x 2 days 10 tablet Payton Mccallumonty, Zacarias Krauter, MD   albuterol (PROVENTIL HFA;VENTOLIN HFA) 108 (90 Base) MCG/ACT inhaler Inhale 1-2 puffs into the lungs every 6 (six) hours as needed for wheezing or shortness of breath. 1  Inhaler Payton Mccallum, MD     1. diagnosis reviewed with patient 2. Patient given duoneb tx x2 with improvement in symptoms 3. rx as per orders above; reviewed possible side effects, interactions, risks and benefits  4. Recommend supportive treatment with fluids, rest 5. Follow-up prn if symptoms worsen or don't improve  Controlled Substance Prescriptions [place] Controlled Substance Registry consulted? Not Applicable   Payton Mccallum, MD 02/21/18 [phone]

## 2018-03-10 ENCOUNTER — Emergency Department
Admission: EM | Admit: 2018-03-10 | Discharge: 2018-03-10 | Disposition: A | Discharge status: Home / Self Care | Payer: BLUE CROSS/BLUE SHIELD | Attending: Emergency Medicine | Admitting: Emergency Medicine

## 2018-03-10 ENCOUNTER — Other Ambulatory Visit: Payer: Self-pay

## 2018-03-10 DIAGNOSIS — L03113 Cellulitis of right upper limb: Secondary | ICD-10-CM | POA: Insufficient documentation

## 2018-03-10 DIAGNOSIS — Z79899 Other long term (current) drug therapy: Secondary | ICD-10-CM | POA: Diagnosis not present

## 2018-03-10 DIAGNOSIS — I10 Essential (primary) hypertension: Secondary | ICD-10-CM | POA: Diagnosis not present

## 2018-03-10 DIAGNOSIS — Z87891 Personal history of nicotine dependence: Secondary | ICD-10-CM | POA: Insufficient documentation

## 2018-03-10 DIAGNOSIS — R2231 Localized swelling, mass and lump, right upper limb: Secondary | ICD-10-CM | POA: Insufficient documentation

## 2018-03-10 DIAGNOSIS — E119 Type 2 diabetes mellitus without complications: Secondary | ICD-10-CM | POA: Insufficient documentation

## 2018-03-10 DIAGNOSIS — M79641 Pain in right hand: Secondary | ICD-10-CM | POA: Diagnosis present

## 2018-03-10 MED ORDER — CEPHALEXIN 500 MG PO CAPS
500.0000 mg | ORAL_CAPSULE | Freq: Once | ORAL | Status: AC
  Administered 2018-03-10: 500 mg via ORAL
  Filled 2018-03-10: qty 1

## 2018-03-10 MED ORDER — CEPHALEXIN 500 MG PO CAPS: 500.0000 mg | ORAL_CAPSULE | Freq: Two times a day (BID) | ORAL | 0 refills | Status: AC

## 2018-03-10 NOTE — ED Notes (Signed)
Signature pad not working in room pt signed form

## 2018-03-10 NOTE — ED Provider Notes (Signed)
[facility] Emergency Department Provider Note ___________________   First MD Initiated Contact with Patient 03/10/18 [phone]     (approximate)  I have reviewed the triage vital signs and the nursing notes.   HISTORY  Chief Complaint Hand Pain   HPI Ronald Arias is a 37 y.o. male presents to the emergency department with a 1 day history of progressive right hand pain swelling and redness.  Patient denies any fever.  Patient denies any recent known injury.   Past Medical History:  Diagnosis Date  . Hypertension     Patient Active Problem List   Diagnosis Date Noted  . Chronic chest pain 07/25/2017  . Type 2 diabetes mellitus without complication, without long-term current use of insulin (HCC) 07/25/2017  . Dizziness and giddiness 07/09/2016  . Vitamin D deficiency 03/05/2015  . Obesity (BMI 30-39.9) 03/04/2015  . Smoker 03/04/2015  . Prediabetes 03/04/2015  . Hx of opioid abuse 03/04/2015  . FH: diabetes mellitus 03/04/2015  . FH: Crohn's disease 03/04/2015  . HTN (hypertension) 02/21/2015    Past Surgical History:  Procedure Laterality Date  . none      Prior to Admission medications   Medication Sig Start Date End Date Taking? Authorizing Provider  albuterol (PROVENTIL HFA;VENTOLIN HFA) 108 (90 Base) MCG/ACT inhaler Inhale 1-2 puffs into the lungs every 6 (six) hours as needed for wheezing or shortness of breath. 02/21/18   Payton Mccallumonty, [place], MD  amLODipine (NORVASC) 10 MG tablet Take 1 tablet (10 mg total) daily by mouth. 07/25/17   Gollan, Tollie Pizzaimothy J, MD  doxycycline (VIBRA-TABS) 100 MG tablet Take 1 tablet (100 mg total) by mouth 2 (two) times daily. 02/21/18   Payton Mccallumonty, [place], MD  lisinopril (PRINIVIL,ZESTRIL) 10 MG tablet Take 1 tablet (10 mg total) daily by mouth. 07/25/17   Gollan, Tollie Pizzaimothy J, MD  predniSONE (DELTASONE) 20 MG tablet 3 tabs po once day 1, then 2 tabs po qd x 2 days, then 1 tab qd x 2 days, then half a tab po qd x 2 days  02/21/18   Payton Mccallumonty, [place], MD    Allergies No known drug allergies  Family History  Problem Relation Age of Onset  . Cancer Mother        breast  . Diabetes Father   . Hypertension Father   . Crohn's disease Brother   . Drug abuse Brother     Social History Social History   Tobacco Use  . Smoking status: Former Smoker    Packs/day: 0.50    Types: Cigarettes    Last attempt to quit: 06/07/2016    Years since quitting: 1.7  . Smokeless tobacco: Never Used  . Tobacco comment: E-cigarettes  Substance Use Topics  . Alcohol use: Yes    Comment: 12 pack of beer per week.   . Drug use: No    Review of Systems Constitutional: No fever/chills Eyes: No visual changes. ENT: No sore throat. Cardiovascular: Denies chest pain. Respiratory: Denies shortness of breath. Gastrointestinal: No abdominal pain.  No nausea, no vomiting.  No diarrhea.  No constipation. Genitourinary: Negative for dysuria. Musculoskeletal: Negative for neck pain.  Negative for back pain.  Positive for right hand pain redness and swelling. Integumentary: Negative for rash.  Positive for right hand redness and swelling. Neurological: Negative for headaches, focal weakness or numbness.   ____________________________________________   PHYSICAL EXAM:  VITAL SIGNS: ED Triage Vitals  Enc Vitals Group     BP 03/10/18 0121 (!) 149/50  Pulse Rate 03/10/18 0121 (!) 102     Resp 03/10/18 0121 17     Temp 03/10/18 0121 98.7 F (37.1 C)     Temp Source 03/10/18 0121 Oral     SpO2 03/10/18 0121 96 %     Weight 03/10/18 0120 136.1 kg (300 lb)     Height 03/10/18 0120 1.803 m (5\' 11" )     Head Circumference --      Peak Flow --      Pain Score 03/10/18 0120 3     Pain Loc --      Pain Edu? --      Excl. in GC? --     Constitutional: Alert and oriented. Well appearing and in no acute distress. Eyes: Conjunctivae are normal. Head: Atraumatic. Mouth/Throat: Mucous membranes are moist.{**  Oropharynx  non-erythematous. Neck: No stridor.  Cardiovascular: Normal rate, regular rhythm. Good peripheral circulation. Grossly normal heart sounds. Respiratory: Normal respiratory effort.  No retractions. Lungs CTAB. Gastrointestinal: Soft and nontender. No distention.  Musculoskeletal: No lower extremity tenderness nor edema. No gross deformities of extremities. Neurologic:  Normal speech and language. No gross focal neurologic deficits are appreciated.  Skin:  Skin is warm, dry and intact.  Blanching erythema dorsal aspect of the right index finger extending to the dorsal aspect of the hand.  Area tender to touch.     Procedures   ____________________________________________   INITIAL IMPRESSION / ASSESSMENT AND PLAN / ED COURSE  As part of my medical decision making, I reviewed the following data within the electronic MEDICAL RECORD NUMBER   37 year old male presented with above-stated history and physical exam consistent with cellulitis.  Patient given Keflex in the emergency department will be prescribed same for home.  Spoke with the patient at length regarding warning signs that would warrant immediate return to the emergency department. ____________________________________________  FINAL CLINICAL IMPRESSION(S) / ED DIAGNOSES  Final diagnoses:  Cellulitis of right hand     MEDICATIONS GIVEN DURING THIS VISIT:  Medications  cephALEXin (KEFLEX) capsule 500 mg (has no administration in time range)     ED Discharge Orders    None       Note:  This document was prepared using Dragon voice recognition software and may include unintentional dictation errors.    Darci Current, MD 03/10/18 [phone]

## 2018-03-10 NOTE — ED Triage Notes (Signed)
Pt arrives to ED via POV with c/o hand pain and swelling x1 day. No known injury or trauma. Pt has was appears to be the beginning stage of cellulitis on the first digit and dorsal aspect of the right hand. CMS intact.

## 2018-04-12 ENCOUNTER — Other Ambulatory Visit: Payer: Self-pay | Admitting: *Deleted

## 2018-04-12 NOTE — Telephone Encounter (Signed)
[facility] [place] : Received request from Pharmacy pt requesting new Rx for Lisinopril 20 mg tablet 1 tablet daily #90 day refill. Pt last ov pt was taking lisinopril 10 mg tablet qd. Contacted pt to verify change.

## 2018-04-13 NOTE — Telephone Encounter (Signed)
LMOVM for pt to contact office concerning his lisinopril.

## 2018-05-21 ENCOUNTER — Telehealth: Payer: Self-pay | Admitting: Cardiovascular Disease

## 2018-05-21 MED ORDER — LISINOPRIL 10 MG PO TABS: 10.0000 mg | ORAL_TABLET | Freq: Every day | ORAL | 1 refills | Status: DC

## 2018-05-21 NOTE — Telephone Encounter (Signed)
°*  STAT* If patient is at the pharmacy, call can be transferred to refill team.   1. Which medications need to be refilled? (please list name of each medication and dose if known) Lisinopril 10 mg   2. Which pharmacy/location (including street and city if local pharmacy) is medication to be sent to? [place] [facility]   3. Do they need a 30 day or 90 day supply? 30 day

## 2018-07-19 ENCOUNTER — Other Ambulatory Visit: Payer: Self-pay | Admitting: *Deleted

## 2018-07-19 ENCOUNTER — Telehealth: Payer: Self-pay | Admitting: Cardiovascular Disease

## 2018-07-19 MED ORDER — LISINOPRIL 10 MG PO TABS: 10.0000 mg | ORAL_TABLET | Freq: Every day | ORAL | 0 refills | Status: DC

## 2018-07-19 MED ORDER — AMLODIPINE BESYLATE 10 MG PO TABS: 10.0000 mg | ORAL_TABLET | Freq: Every day | ORAL | 0 refills | Status: DC

## 2018-07-19 NOTE — Telephone Encounter (Signed)
Requested Prescriptions   Signed Prescriptions Disp Refills  . amLODipine (NORVASC) 10 MG tablet 90 tablet 0    Sig: Take 1 tablet (10 mg total) by mouth daily.    Authorizing Provider: Antonieta Iba    Ordering User: Shawnie Dapper, MARINA C  . lisinopril (PRINIVIL,ZESTRIL) 10 MG tablet 90 tablet 0    Sig: Take 1 tablet (10 mg total) by mouth daily.    Authorizing Provider: Antonieta Iba    Ordering User: Kendrick Fries

## 2018-07-19 NOTE — Telephone Encounter (Signed)
Requested Prescriptions   Signed Prescriptions Disp Refills  . amLODipine (NORVASC) 10 MG tablet 90 tablet 0    Sig: Take 1 tablet (10 mg total) by mouth daily.    Authorizing Provider: GOLLAN, TIMOTHY J    Ordering User: Sadrac Zeoli C  . lisinopril (PRINIVIL,ZESTRIL) 10 MG tablet 90 tablet 0    Sig: Take 1 tablet (10 mg total) by mouth daily.    Authorizing Provider: GOLLAN, TIMOTHY J    Ordering User: Adrijana Haros C    

## 2018-07-19 NOTE — Telephone Encounter (Signed)
°*  STAT* If patient is at the pharmacy, call can be transferred to refill team.   1. Which medications need to be refilled? (please list name of each medication and dose if known) Lisinopril and Amlodipine   2. Which pharmacy/location (including street and city if local pharmacy) is medication to be sent to? [place] [facility]   3. Do they need a 30 day or 90 day supply? 90 day
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
## 2018-07-30 ENCOUNTER — Ambulatory Visit: Payer: BLUE CROSS/BLUE SHIELD | Admitting: Cardiovascular Disease

## 2018-07-30 ENCOUNTER — Encounter: Payer: Self-pay | Admitting: Cardiovascular Disease

## 2018-07-30 VITALS — BP 158/60 | HR 103 | Ht 71.0 in | Wt 296.0 lb

## 2018-07-30 DIAGNOSIS — G8929 Other chronic pain: Secondary | ICD-10-CM | POA: Diagnosis not present

## 2018-07-30 DIAGNOSIS — I10 Essential (primary) hypertension: Secondary | ICD-10-CM | POA: Diagnosis not present

## 2018-07-30 DIAGNOSIS — I209 Angina pectoris, unspecified: Secondary | ICD-10-CM

## 2018-07-30 DIAGNOSIS — R079 Chest pain, unspecified: Secondary | ICD-10-CM

## 2018-07-30 DIAGNOSIS — E119 Type 2 diabetes mellitus without complications: Secondary | ICD-10-CM | POA: Diagnosis not present

## 2018-07-30 DIAGNOSIS — F17200 Nicotine dependence, unspecified, uncomplicated: Secondary | ICD-10-CM

## 2018-07-30 DIAGNOSIS — R0602 Shortness of breath: Secondary | ICD-10-CM

## 2018-07-30 DIAGNOSIS — R42 Dizziness and giddiness: Secondary | ICD-10-CM

## 2018-07-30 MED ORDER — AMLODIPINE BESYLATE 10 MG PO TABS: 10.0000 mg | ORAL_TABLET | Freq: Every day | ORAL | 4 refills | Status: DC

## 2018-07-30 MED ORDER — LISINOPRIL 20 MG PO TABS: 20.0000 mg | ORAL_TABLET | Freq: Every day | ORAL | 4 refills | Status: DC

## 2018-07-30 NOTE — Patient Instructions (Addendum)
Medication Instructions:  No changes  If you need a refill on your cardiac medications before your next appointment, please call your pharmacy.    Lab work: No new labs needed   If you have labs (blood work) drawn today and your tests are completely normal, you will receive your results only by: Marland Kitchen. MyChart Message (if you have MyChart) OR . A paper copy in the mail If you have any lab test that is abnormal or we need to change your treatment, we will call you to review the results.   Testing/Procedures: We will order an echo stress test/treadmill SOB, angina  Your physician has requested that you have a stress echocardiogram. For further information please visit [email or web address]. Please follow instruction sheet as given.   Do not drink or eat foods with caffeine for 24 hours before the test. (Chocolate, coffee, tea, or energy drinks)  If you use an inhaler, bring it with you to the test.  Do not smoke for 4 hours before the test.  Wear comfortable shoes and clothing.  Follow-Up: At [facility], you and your health needs are our priority.  As part of our continuing mission to provide you with exceptional heart care, we have created designated Provider Care Teams.  These Care Teams include your primary Cardiologist (physician) and Advanced Practice Providers (APPs -  Physician Assistants and Nurse Practitioners) who all work together to provide you with the care you need, when you need it.  . You will need a follow up appointment as needed  . Providers on your designated Care Team:   . Nicolasa Duckinghristopher Berge, NP . Eula Listenyan Dunn, PA-C . Marisue IvanJacquelyn Visser, PA-C  Any Other Special Instructions Will Be Listed Below (If Applicable).  For educational health videos Log in to : [email or web address] Or : [email or web address], password : [email or web address]

## 2018-09-03 ENCOUNTER — Other Ambulatory Visit: Payer: Self-pay | Admitting: Cardiovascular Disease

## 2018-09-03 NOTE — Telephone Encounter (Signed)
Pt will contact pharmacy for refill. Pt doesn't need refill today pharmacy should have refill on file.

## 2019-08-07 ENCOUNTER — Other Ambulatory Visit: Payer: Self-pay | Admitting: Cardiovascular Disease

## 2019-08-07 DIAGNOSIS — E119 Type 2 diabetes mellitus without complications: Secondary | ICD-10-CM

## 2019-08-07 DIAGNOSIS — I10 Essential (primary) hypertension: Secondary | ICD-10-CM

## 2019-08-21 ENCOUNTER — Other Ambulatory Visit: Payer: Self-pay | Admitting: Cardiovascular Disease

## 2019-08-21 DIAGNOSIS — I10 Essential (primary) hypertension: Secondary | ICD-10-CM

## 2019-08-21 DIAGNOSIS — E119 Type 2 diabetes mellitus without complications: Secondary | ICD-10-CM

## 2019-08-21 MED ORDER — AMLODIPINE BESYLATE 10 MG PO TABS: 10.0000 mg | ORAL_TABLET | Freq: Every day | ORAL | 0 refills | Status: DC

## 2019-08-21 MED ORDER — LISINOPRIL 20 MG PO TABS: 20.0000 mg | ORAL_TABLET | Freq: Every day | ORAL | 0 refills | Status: DC

## 2019-08-21 NOTE — Telephone Encounter (Signed)
Requested Prescriptions   Signed Prescriptions Disp Refills  . amLODipine (NORVASC) 10 MG tablet 30 tablet 0    Sig: Take 1 tablet (10 mg total) by mouth daily. *NEEDS OFFICE VISIT FOR FURTHER REFILLS*    Authorizing Provider: Minna Merritts    Ordering User: NEWCOMER MCCLAIN, BRANDY L  . lisinopril (ZESTRIL) 20 MG tablet 30 tablet 0    Sig: Take 1 tablet (20 mg total) by mouth daily. *NEEDS OFFICE VISIT FOR FURTHER REFILLS*    Authorizing Provider: Minna Merritts    Ordering User: Raelene Bott, BRANDY L

## 2019-08-21 NOTE — Telephone Encounter (Signed)
°*  STAT* If patient is at the pharmacy, call can be transferred to refill team.   1. Which medications need to be refilled? (please list name of each medication and dose if known)  amlodipine 10 MG Lisinopril 20 MG   2. Which pharmacy/location (including street and city if local pharmacy) is medication to be sent to? [facility] in [place]   3. Do they need a 30 day or 90 day supply? Patient states he should have one last refill available.  Patient's insurance is with [facility] and is unable to reschedule a follow up but he is in between getting a new cardiologist.  States if we are unable to fill he would like to know what he can do to get medications.  Please review and advise.

## 2019-09-23 ENCOUNTER — Other Ambulatory Visit: Payer: Self-pay | Admitting: Cardiovascular Disease

## 2019-09-23 DIAGNOSIS — E119 Type 2 diabetes mellitus without complications: Secondary | ICD-10-CM

## 2019-09-23 DIAGNOSIS — I10 Essential (primary) hypertension: Secondary | ICD-10-CM

## 2019-09-23 NOTE — Telephone Encounter (Signed)
°*  STAT* If patient is at the pharmacy, call can be transferred to refill team.   1. Which medications need to be refilled? (please list name of each medication and dose if known)   Amlodipine 10 mg po q d   2. Which pharmacy/location (including street and city if local pharmacy) is medication to be sent to?  [facility] [place]   3. Do they need a 30 day or 90 day supply? 30  Patient has new [employer] and has to establish with [employer].  Please provide a 30 day refill and referral if able.

## 2019-09-24 ENCOUNTER — Other Ambulatory Visit: Payer: Self-pay | Admitting: *Deleted

## 2019-09-24 DIAGNOSIS — E119 Type 2 diabetes mellitus without complications: Secondary | ICD-10-CM

## 2019-09-24 DIAGNOSIS — I10 Essential (primary) hypertension: Secondary | ICD-10-CM

## 2019-09-24 MED ORDER — AMLODIPINE BESYLATE 10 MG PO TABS: 10.0000 mg | ORAL_TABLET | Freq: Every day | ORAL | 0 refills | Status: DC

## 2019-09-24 NOTE — Telephone Encounter (Signed)
Pt requesting referral for [facility] network due to change in insurance.  Please advise concerning referral. Pt is overdue for 12 month f/u 11/20.

## 2019-09-24 NOTE — Telephone Encounter (Signed)
Routing call to Dr. Mariah Milling.

## 2019-09-25 ENCOUNTER — Other Ambulatory Visit: Payer: Self-pay | Admitting: Cardiovascular Disease

## 2019-09-26 MED ORDER — LISINOPRIL 20 MG PO TABS: 20.0000 mg | ORAL_TABLET | Freq: Every day | ORAL | 0 refills | Status: DC

## 2019-09-26 MED ORDER — AMLODIPINE BESYLATE 10 MG PO TABS: 10.0000 mg | ORAL_TABLET | Freq: Every day | ORAL | 0 refills | Status: AC

## 2019-09-26 NOTE — Telephone Encounter (Signed)
He can most likely call the [facility] cardiology appt line to make an appt. Referral should not be needed

## 2019-09-26 NOTE — Telephone Encounter (Addendum)
Spoke with patient and due to delay in scheduling he needs medications to cover until appointment in March. Sent in 90 day meds with no refills to last until he establishs care with them. He was appreciative with no further questions.

## 2019-10-09 NOTE — Telephone Encounter (Signed)
[facility] pharmacy [facility]) sent fax stating "patient says he is taking lisinopril 20mg  BID not QD". Please advise if OK to refill with new directions.

## 2019-10-09 NOTE — Telephone Encounter (Signed)
Left voicemail message to please call and clarify how he is taking this medication and that I would need to get provider approval for twice a day dosing if he is in fact taking it that way.

## 2019-10-10 ENCOUNTER — Encounter: Payer: Self-pay | Admitting: Emergency Medicine

## 2019-10-10 ENCOUNTER — Emergency Department: Payer: BLUE CROSS/BLUE SHIELD

## 2019-10-10 ENCOUNTER — Inpatient Hospital Stay
Admission: EM | Admit: 2019-10-10 | Discharge: 2019-10-12 | DRG: 164 | Disposition: A | Discharge status: Home / Self Care | Payer: BLUE CROSS/BLUE SHIELD | Attending: Internal Medicine | Admitting: Internal Medicine

## 2019-10-10 ENCOUNTER — Other Ambulatory Visit: Payer: Self-pay

## 2019-10-10 ENCOUNTER — Ambulatory Visit (INDEPENDENT_AMBULATORY_CARE_PROVIDER_SITE_OTHER)
Admission: EM | Admit: 2019-10-10 | Discharge: 2019-10-10 | Disposition: A | Discharge status: Other Acute Inpatient Hospital | Payer: BLUE CROSS/BLUE SHIELD | Source: Home / Self Care

## 2019-10-10 DIAGNOSIS — Z803 Family history of malignant neoplasm of breast: Secondary | ICD-10-CM | POA: Diagnosis not present

## 2019-10-10 DIAGNOSIS — Z23 Encounter for immunization: Secondary | ICD-10-CM

## 2019-10-10 DIAGNOSIS — E119 Type 2 diabetes mellitus without complications: Secondary | ICD-10-CM | POA: Diagnosis present

## 2019-10-10 DIAGNOSIS — R0902 Hypoxemia: Secondary | ICD-10-CM | POA: Insufficient documentation

## 2019-10-10 DIAGNOSIS — M79605 Pain in left leg: Secondary | ICD-10-CM

## 2019-10-10 DIAGNOSIS — I82442 Acute embolism and thrombosis of left tibial vein: Secondary | ICD-10-CM | POA: Diagnosis present

## 2019-10-10 DIAGNOSIS — Z20822 Contact with and (suspected) exposure to covid-19: Secondary | ICD-10-CM | POA: Diagnosis present

## 2019-10-10 DIAGNOSIS — Z833 Family history of diabetes mellitus: Secondary | ICD-10-CM | POA: Diagnosis not present

## 2019-10-10 DIAGNOSIS — I2699 Other pulmonary embolism without acute cor pulmonale: Principal | ICD-10-CM | POA: Diagnosis present

## 2019-10-10 DIAGNOSIS — I82409 Acute embolism and thrombosis of unspecified deep veins of unspecified lower extremity: Secondary | ICD-10-CM

## 2019-10-10 DIAGNOSIS — I82402 Acute embolism and thrombosis of unspecified deep veins of left lower extremity: Secondary | ICD-10-CM

## 2019-10-10 DIAGNOSIS — I82412 Acute embolism and thrombosis of left femoral vein: Secondary | ICD-10-CM | POA: Diagnosis present

## 2019-10-10 DIAGNOSIS — Z87891 Personal history of nicotine dependence: Secondary | ICD-10-CM | POA: Diagnosis not present

## 2019-10-10 DIAGNOSIS — E875 Hyperkalemia: Secondary | ICD-10-CM | POA: Diagnosis present

## 2019-10-10 DIAGNOSIS — Z813 Family history of other psychoactive substance abuse and dependence: Secondary | ICD-10-CM

## 2019-10-10 DIAGNOSIS — R0602 Shortness of breath: Secondary | ICD-10-CM

## 2019-10-10 DIAGNOSIS — I82432 Acute embolism and thrombosis of left popliteal vein: Secondary | ICD-10-CM | POA: Diagnosis present

## 2019-10-10 DIAGNOSIS — R0989 Other specified symptoms and signs involving the circulatory and respiratory systems: Secondary | ICD-10-CM | POA: Insufficient documentation

## 2019-10-10 DIAGNOSIS — Z8249 Family history of ischemic heart disease and other diseases of the circulatory system: Secondary | ICD-10-CM | POA: Diagnosis not present

## 2019-10-10 DIAGNOSIS — R000 Tachycardia, unspecified: Secondary | ICD-10-CM | POA: Insufficient documentation

## 2019-10-10 DIAGNOSIS — M7989 Other specified soft tissue disorders: Secondary | ICD-10-CM

## 2019-10-10 DIAGNOSIS — E871 Hypo-osmolality and hyponatremia: Secondary | ICD-10-CM | POA: Diagnosis present

## 2019-10-10 DIAGNOSIS — R2242 Localized swelling, mass and lump, left lower limb: Secondary | ICD-10-CM | POA: Diagnosis not present

## 2019-10-10 DIAGNOSIS — I10 Essential (primary) hypertension: Secondary | ICD-10-CM

## 2019-10-10 LAB — CBC
HCT: 47.7 % (ref 39.0–52.0)
Hemoglobin: 15.5 g/dL (ref 13.0–17.0)
MCH: 27.4 pg (ref 26.0–34.0)
MCHC: 32.5 g/dL (ref 30.0–36.0)
MCV: 84.3 fL (ref 80.0–100.0)
Platelets: 247 10*3/uL (ref 150–400)
RBC: 5.66 MIL/uL (ref 4.22–5.81)
RDW: 13.2 % (ref 11.5–15.5)
WBC: 15.4 10*3/uL — ABNORMAL HIGH (ref 4.0–10.5)
nRBC: 0 % (ref 0.0–0.2)

## 2019-10-10 LAB — PROTIME-INR
INR: 1 (ref 0.8–1.2)
Prothrombin Time: 12.8 seconds (ref 11.4–15.2)

## 2019-10-10 LAB — APTT: aPTT: 28 seconds (ref 24–36)

## 2019-10-10 LAB — BASIC METABOLIC PANEL
Anion gap: 13 (ref 5–15)
BUN: 14 mg/dL (ref 6–20)
CO2: 20 mmol/L — ABNORMAL LOW (ref 22–32)
Calcium: 9.4 mg/dL (ref 8.9–10.3)
Chloride: 96 mmol/L — ABNORMAL LOW (ref 98–111)
Creatinine, Ser: 0.86 mg/dL (ref 0.61–1.24)
GFR calc Af Amer: >60 mL/min (ref >60)
GFR calc non Af Amer: >60 mL/min (ref >60)
Glucose, Bld: 420 mg/dL — ABNORMAL HIGH (ref 70–99)
Potassium: 4.4 mmol/L (ref 3.5–5.1)
Sodium: 129 mmol/L — ABNORMAL LOW (ref 135–145)

## 2019-10-10 LAB — RESPIRATORY PANEL BY RT PCR (FLU A&B, COVID)
Influenza A by PCR: NEGATIVE
Influenza B by PCR: NEGATIVE
SARS Coronavirus 2 by RT PCR: NEGATIVE

## 2019-10-10 LAB — TROPONIN I (HIGH SENSITIVITY)
Troponin I (High Sensitivity): 6 ng/L (ref <18)
Troponin I (High Sensitivity): 4 ng/L (ref <18)

## 2019-10-10 LAB — GLUCOSE, CAPILLARY: Glucose-Capillary: 315 mg/dL — ABNORMAL HIGH (ref 70–99)

## 2019-10-10 MED ORDER — INSULIN ASPART 100 UNIT/ML ~~LOC~~ SOLN
0.0000 [IU] | SUBCUTANEOUS | Status: DC
  Administered 2019-10-10 – 2019-10-11 (×2): 11 [IU] via SUBCUTANEOUS
  Administered 2019-10-11: 15 [IU] via SUBCUTANEOUS
  Administered 2019-10-11 (×2): 5 [IU] via SUBCUTANEOUS
  Administered 2019-10-12: 15 [IU] via SUBCUTANEOUS
  Filled 2019-10-10 (×6): qty 1

## 2019-10-10 MED ORDER — IOHEXOL 350 MG/ML SOLN
100.0000 mL | Freq: Once | INTRAVENOUS | Status: AC | PRN
  Administered 2019-10-10: 100 mL via INTRAVENOUS

## 2019-10-10 MED ORDER — SODIUM CHLORIDE 0.9 % IV SOLN: INTRAVENOUS | Status: DC

## 2019-10-10 MED ORDER — HEPARIN (PORCINE) 25000 UT/250ML-% IV SOLN
2050.0000 [IU]/h | INTRAVENOUS | Status: DC
  Administered 2019-10-10 – 2019-10-11 (×2): 1650 [IU]/h via INTRAVENOUS
  Administered 2019-10-11 (×2): 1850 [IU]/h via INTRAVENOUS
  Administered 2019-10-12: 07:00:00 2050 [IU]/h via INTRAVENOUS
  Filled 2019-10-10 (×4): qty 250

## 2019-10-10 MED ORDER — ONDANSETRON HCL 4 MG/2ML IJ SOLN: 4.0000 mg | Freq: Four times a day (QID) | INTRAMUSCULAR | Status: DC | PRN

## 2019-10-10 MED ORDER — ADULT MULTIVITAMIN W/MINERALS CH
1.0000 | ORAL_TABLET | Freq: Every day | ORAL | Status: DC
  Administered 2019-10-10 – 2019-10-12 (×2): 1 via ORAL
  Filled 2019-10-10 (×2): qty 1

## 2019-10-10 MED ORDER — HEPARIN BOLUS VIA INFUSION
6000.0000 [IU] | Freq: Once | INTRAVENOUS | Status: AC
  Administered 2019-10-10: 19:00:00 6000 [IU] via INTRAVENOUS
  Filled 2019-10-10: qty 6000

## 2019-10-10 MED ORDER — ACETAMINOPHEN 325 MG PO TABS
650.0000 mg | ORAL_TABLET | ORAL | Status: DC | PRN
  Administered 2019-10-10 – 2019-10-12 (×5): 650 mg via ORAL
  Filled 2019-10-10 (×5): qty 2

## 2019-10-10 NOTE — ED Provider Notes (Signed)
[facility] Emergency Department Provider Note  ____________________________________________   First MD Initiated Contact with Patient 10/10/19 [phone]     (approximate)  I have reviewed the triage vital signs and the nursing notes.  History  Chief Complaint Leg Pain    HPI Ronald Arias is a 39 y.o. male with history HTN, DM who presents emergency department for left lower leg swelling and pain.  Symptoms started yesterday.  Denies any preceding trauma.  Describes the pain as a burning type sensation, 8/10 in severity, no radiation, no alleviating or aggravating components.  Noted to be hypoxic to 88% on room air, placed on 2 L nasal cannula.  He reports chronic shortness of breath and dyspnea on exertion which he attributes to his obesity, states this has been unchanged.  No particular worsening of this that he has noted.  No chest pain.  No hemoptysis.  No recent prolonged immobilization.  No history of DVT or VTE.   Past Medical Hx Past Medical History:  Diagnosis Date  . Hypertension     Problem List Patient Active Problem List   Diagnosis Date Noted  . Chronic chest pain 07/25/2017  . Type 2 diabetes mellitus without complication, without long-term current use of insulin (HCC) 07/25/2017  . Dizziness and giddiness 07/09/2016  . Vitamin D deficiency 03/05/2015  . Obesity (BMI 30-39.9) 03/04/2015  . Smoker 03/04/2015  . Prediabetes 03/04/2015  . Hx of opioid abuse (HCC) 03/04/2015  . FH: diabetes mellitus 03/04/2015  . FH: Crohn's disease 03/04/2015  . HTN (hypertension) 02/21/2015    Past Surgical Hx Past Surgical History:  Procedure Laterality Date  . none      Medications Prior to Admission medications   Medication Sig Start Date End Date Taking? Authorizing Provider  albuterol (PROVENTIL HFA;VENTOLIN HFA) 108 (90 Base) MCG/ACT inhaler Inhale 1-2 puffs into the lungs every 6 (six) hours as needed for wheezing or shortness of  breath. Patient taking differently: Inhale 1-2 puffs into the lungs every 6 (six) hours as needed for wheezing or shortness of breath.  02/21/18   Payton Mccallum, MD  amLODipine (NORVASC) 10 MG tablet Take 1 tablet (10 mg total) by mouth daily. 09/26/19   Antonieta Iba, MD  lisinopril (ZESTRIL) 20 MG tablet Take 1 tablet (20 mg total) by mouth daily. 09/26/19   Antonieta Iba, MD    Allergies Patient has no known allergies.  Family Hx Family History  Problem Relation Age of Onset  . Cancer Mother        breast  . Diabetes Father   . Hypertension Father   . Crohn's disease Brother   . Drug abuse Brother     Social Hx Social History   Tobacco Use  . Smoking status: Former Smoker    Packs/day: 0.50    Types: Cigarettes    Quit date: 06/07/2016    Years since quitting: 3.3  . Smokeless tobacco: Never Used  . Tobacco comment: E-cigarettes  Substance Use Topics  . Alcohol use: Not Currently    Comment: 12 pack of beer per week.   . Drug use: No     Review of Systems  Constitutional: Negative for fever, chills. Eyes: Negative for visual changes. ENT: Negative for sore throat. Cardiovascular: Negative for chest pain. Respiratory: + for shortness of breath. Gastrointestinal: Negative for nausea, vomiting.  Genitourinary: Negative for dysuria. Musculoskeletal: + for leg swelling. Skin: Negative for rash. Neurological: Negative for headaches.   Physical Exam  Vital  Signs: ED Triage Vitals  Enc Vitals Group     BP 10/10/19 1607 125/78     Pulse Rate 10/10/19 1607 (!) 133     Resp 10/10/19 1607 18     Temp 10/10/19 1607 98.5 F (36.9 C)     Temp Source 10/10/19 1607 Oral     SpO2 10/10/19 1607 95 %     Weight 10/10/19 1611 300 lb (136.1 kg)     Height 10/10/19 1611 5\' 11"  (1.803 m)     Head Circumference --      Peak Flow --      Pain Score 10/10/19 1610 6     Pain Loc --      Pain Edu? --      Excl. in GC? --     Constitutional: Alert and oriented.    Head: Normocephalic. Atraumatic. Eyes: Conjunctivae clear. Sclera anicteric. Nose: No congestion. No rhinorrhea. Mouth/Throat: Wearing mask.  Neck: No stridor.   Cardiovascular: Tachycardic, regular rhythm. Extremities well perfused. Respiratory: Normal respiratory effort.  Lungs CTAB.  Hypoxic to 88% on RA, placed on 2 LNC. Gastrointestinal: Soft. Non-tender. Non-distended.  Musculoskeletal: LLE swelling about the calf, asymmetric compared to the right. Neurologic:  Normal speech and language. No gross focal neurologic deficits are appreciated.  Skin: Skin is warm, dry and intact. No rash noted. Psychiatric: Mood and affect are appropriate for situation.  EKG  Personally reviewed.   Rate: 130 Rhythm: sinus Axis: normal Intervals: WNL TWI precordial leads No STEMI    Radiology  CT PE: IMPRESSION:  1. Pulmonary emboli involving the left pulmonary artery and the  lobar, segmental, and subsegmental branches supplying the left lung  as well as involving subsegmental branches supplying the right lower  lobe. There is an RV to LV ratio of 0.9 which is suggestive of right  heart strain. This is consistent with at least submassive  (intermediate risk) PE. The presence of right heart strain has been  associated with an increased risk of morbidity and mortality. Please  activate Code PE by paging [phone].  2. Calcified pleural thickening with associated atelectasis/scarring  involving the right lung which may reflect prior asbestos exposure.  3. Pleural thickening versus a small loculated pleural effusion  involving the left lung.  4. Hepatic steatosis.   [phone] LLE: IMPRESSION:  Acute DVT seen within the left femoral vein, popliteal vein and  posterior tibial vein in the left calf.    Procedures  Procedure(s) performed (including critical care):  .Critical Care Performed by: [place]., MD Authorized by: Miguel Aschoff., MD   Critical care provider statement:     Critical care time (minutes):  45   Critical care was time spent personally by me on the following activities:  Discussions with consultants, evaluation of patient's response to treatment, examination of patient, ordering and performing treatments and interventions, ordering and review of laboratory studies, ordering and review of radiographic studies, pulse oximetry, re-evaluation of patient's condition, obtaining history from patient or surrogate and review of old charts     Initial Impression / Assessment and Plan / ED Course  39 y.o. male who presents to the ED for left leg swelling, chronic SOB/DOE.  CT reveals large left-sided PE, as well as subsegmental right-sided PE.  Increased RV to LV ratio suggestive of right heart strain.  Troponin x 2 negative.  Tachycardic, on 2 L nasal cannula, but normotensive.  Initiated heparin drip - patient denies any recent head injury, hematuria, bloody stool,  or hx coagulopathies.  Discussed case with vascular, who will likely take for thrombectomy tomorrow.  Discussed w/ hospitalist for admission.   Final Clinical Impression(s) / ED Diagnosis  Final diagnoses:  Left leg pain  Acute pulmonary embolism without acute cor pulmonale, unspecified pulmonary embolism type [facility])       Note:  This document was prepared using Dragon voice recognition software and may include unintentional dictation errors.   Lilia Pro., MD 10/11/19 [phone]

## 2019-10-10 NOTE — Progress Notes (Signed)
eLink Physician-Brief Progress Note Patient Name: Ronald Arias DOB: 1981-01-11 MRN: [id number]   Date of Service  10/10/2019  HPI/Events of Note  PT seen in ED for SOB and diagnosed with bilateral PE. RV/LV ratio 0.9, on 2 liters of oxygen via nasal cannula, normal blood pressure, EKG sinus tachycardia which resolved but otherwise unremarkable.  eICU Interventions  Pt okay to be admitted to ICU by hospitalist (Step down status), ECHO in a.m to risk stratify.        Migdalia Dk 10/10/2019, 9:46 PM

## 2019-10-10 NOTE — H&P (Signed)
[place] at [facility]   PATIENT NAME: Ronald Arias    MR#:  [id number]  DATE OF BIRTH:  November 25, 1980  DATE OF ADMISSION:  10/10/2019  PRIMARY CARE PHYSICIAN: Patient, No Pcp Per   REQUESTING/REFERRING PHYSICIAN: Paschal Dopp, MD  CHIEF COMPLAINT:   Chief Complaint  Patient presents with  . Leg Pain    HISTORY OF PRESENT ILLNESS:  Ronald Arias  is a 39 y.o. Caucasian male with a known history of hypertension and type 2 diabetes mellitus, presented to the emergency room with onset of left leg pain and swelling that started this morning with associated dyspnea mainly on exertion with no cough or wheezing or chest pain or hemoptysis or palpitations.  He denied any nausea or vomiting or abdominal pain.  No bleeding diathesis.  No fever or chills.  No exposure to COVID-19.  The patient denies any recent surgeries or long travels.  He admitted to being nonambulatory over the last 3 days as he states he did not feel right.  Upon presentation to the emergency room, blood pressure was 144/77 with a heart rate of 140 and respirate of 22 and pulse oximetry 90% on 2 L of O2 by nasal cannula.  Labs revealed hyponatremia 129 with hyperkalemia of 96.  High-sensitivity troponin I was 6 and later 4.  CBC showed leukocytosis of 15.4.  INR was 1 with PT of 12.8.  Blood glucose was elevated at 420.  Influenza AMB antigen as well as COVID-19 PCR came back negative.  Chest CTA showed pulmonary emboli involving the left pulmonary artery and the lobar, segmental and subsegmental branches supplying the left lung as well as subsegmental branches supplying the right lower lobe with RV to LV ratio of 0.9 that is suggestive of right heart strain and consistent with at least submassive PE.  It also showed calcified pleural thickening with associated atelectasis/scarring involving the right lung that may represent previous asbestos exposure.  There was pleural thickening versus a small loculated  pleural effusion in the left lung and hepatic steatosis.  Left lower extremity venous Doppler revealed acute DVT seen in the left femoral vein, popliteal vein and posterior tibial vein in the left calf.  Contact was made to vascular surgery who will plan on catheter induced thrombolysis in a.m.  The patient was given IV heparin bolus and drip.  He will be admitted to stepdown unit for further evaluation and management. PAST MEDICAL HISTORY:   Past Medical History:  Diagnosis Date  . Hypertension   Type 2 diabetes mellitus Obesity Ex-smoker PAST SURGICAL HISTORY:   Past Surgical History:  Procedure Laterality Date  . none      SOCIAL HISTORY:   Social History   Tobacco Use  . Smoking status: Former Smoker    Packs/day: 0.50    Types: Cigarettes    Quit date: 06/07/2016    Years since quitting: 3.3  . Smokeless tobacco: Never Used  . Tobacco comment: E-cigarettes  Substance Use Topics  . Alcohol use: Not Currently    Comment: 12 pack of beer per week.     FAMILY HISTORY:   Family History  Problem Relation Age of Onset  . Cancer Mother        breast  . Diabetes Father   . Hypertension Father   . Crohn's disease Brother   . Drug abuse Brother     DRUG ALLERGIES:  No Known Allergies  REVIEW OF SYSTEMS:   ROS As per history of present  illness. All pertinent systems were reviewed above. Constitutional,  HEENT, cardiovascular, respiratory, GI, GU, musculoskeletal, neuro, psychiatric, endocrine,  integumentary and hematologic systems were reviewed and are otherwise  negative/unremarkable except for positive findings mentioned above in the HPI.   MEDICATIONS AT HOME:   Prior to Admission medications   Medication Sig Start Date End Date Taking? Authorizing Provider  amLODipine (NORVASC) 10 MG tablet Take 1 tablet (10 mg total) by mouth daily. 09/26/19  Yes Gollan, Tollie Pizza, MD  lisinopril (ZESTRIL) 20 MG tablet Take 1 tablet (20 mg total) by mouth daily. Patient  taking differently: Take 20 mg by mouth 2 (two) times daily.  09/26/19  Yes Gollan, Tollie Pizza, MD  Multiple Vitamin (MULTIVITAMIN WITH MINERALS) TABS tablet Take 1 tablet by mouth daily.   Yes [provider]      VITAL SIGNS:  Blood pressure 115/78, pulse 91, temperature 98.5 F (36.9 C), temperature source Oral, resp. rate (!) 25, height 5\' 11"  (1.803 m), weight 136.1 kg, SpO2 96 %.  PHYSICAL EXAMINATION:  Physical Exam  GENERAL:  39 y.o.-year-old obese Caucasian male patient lying in the bed with mild respiratory distress with conversational dyspnea. EYES: Pupils equal, round, reactive to light and accommodation. No scleral icterus. Extraocular muscles intact.  HEENT: Head atraumatic, normocephalic. Oropharynx and nasopharynx clear.  NECK:  Supple, no jugular venous distention. No thyroid enlargement, no tenderness.  LUNGS: Normal breath sounds bilaterally, no wheezing, rales,rhonchi or crepitation. No use of accessory muscles of respiration.  CARDIOVASCULAR: Regular rate and rhythm, S1, S2 normal. No murmurs, rubs, or gallops.  ABDOMEN: Soft, nondistended, nontender. Bowel sounds present. No organomegaly or mass.  EXTREMITIES: No pedal edema, cyanosis, or clubbing.  NEUROLOGIC: Cranial nerves II through XII are intact. Muscle strength 5/5 in all extremities. Sensation intact. Gait not checked.  PSYCHIATRIC: The patient is alert and oriented x 3.  Normal affect and good eye contact. SKIN: No obvious rash, lesion, or ulcer.   LABORATORY PANEL:   CBC Recent Labs  Lab 10/10/19 1614  WBC 15.4*  HGB 15.5  HCT 47.7  PLT 247   ------------------------------------------------------------------------------------------------------------------  Chemistries  Recent Labs  Lab 10/10/19 1614  NA 129*  K 4.4  CL 96*  CO2 20*  GLUCOSE 420*  BUN 14  CREATININE 0.86  CALCIUM 9.4    ------------------------------------------------------------------------------------------------------------------  Cardiac Enzymes No results for input(s): TROPONINI in the last 168 hours. ------------------------------------------------------------------------------------------------------------------  RADIOLOGY:  CT Angio Chest PE W and/or Wo Contrast  Result Date: 10/10/2019 CLINICAL DATA:  Shortness of breath EXAM: CT ANGIOGRAPHY CHEST WITH CONTRAST TECHNIQUE: Multidetector CT imaging of the chest was performed using the standard protocol during bolus administration of intravenous contrast. Multiplanar CT image reconstructions and MIPs were obtained to evaluate the vascular anatomy. CONTRAST:  10/12/2019 OMNIPAQUE IOHEXOL 350 MG/ML SOLN COMPARISON:  Chest radiograph dated 09/09/2016. FINDINGS: Cardiovascular: Satisfactory opacification of the pulmonary arteries to the segmental level. Pulmonary emboli are seen involving the left pulmonary artery and the lobar, segmental, and subsegmental branches supplying the left lung as well as involving subsegmental branches supplying the right lower lobe. The internal diameter of the right ventricle is 4.4 cm compared to 4.8 of the left ventricle in the axial plane (RV to LV ratio equals 0.9), suggestive of right heart strain. The heart is mildly enlarged. No pericardial effusion. Mediastinum/Nodes: No enlarged mediastinal, hilar, or axillary lymph nodes. Thyroid gland, trachea, and esophagus demonstrate no significant findings. Lungs/Pleura: Paraseptal emphysema is noted. There is no focal consolidation or pneumothorax. Atelectasis/scarring  is seen in the posterior right lung and in the left lower lobe. There is pleural thickening with calcification involving the right lung. A loculated pleural effusion versus pleural thickening is seen in the lateral left lung. Upper Abdomen: There is hepatic steatosis. Musculoskeletal: No chest wall abnormality. No acute or  significant osseous findings. Review of the MIP images confirms the above findings. IMPRESSION: 1. Pulmonary emboli involving the left pulmonary artery and the lobar, segmental, and subsegmental branches supplying the left lung as well as involving subsegmental branches supplying the right lower lobe. There is an RV to LV ratio of 0.9 which is suggestive of right heart strain. This is consistent with at least submassive (intermediate risk) PE. The presence of right heart strain has been associated with an increased risk of morbidity and mortality. Please activate Code PE by paging [phone]. 2. Calcified pleural thickening with associated atelectasis/scarring involving the right lung which may reflect prior asbestos exposure. 3. Pleural thickening versus a small loculated pleural effusion involving the left lung. 4. Hepatic steatosis. Emphysema (ICD10-J43.9). These results were called by telephone at the time of interpretation on 10/10/2019 at 7:00 pm to provider Dr. Joan Mayans, who verbally acknowledged these results. Electronically Signed   By: Zerita Boers M.D.   On: 10/10/2019 19:08   US Venous Img Lower Unilateral Left (DVT)  Result Date: 10/10/2019 CLINICAL DATA:  Left leg pain, swelling EXAM: LEFT LOWER EXTREMITY VENOUS DOPPLER ULTRASOUND TECHNIQUE: Gray-scale sonography with graded compression, as well as color Doppler and duplex ultrasound were performed to evaluate the lower extremity deep venous systems from the level of the common femoral vein and including the common femoral, femoral, profunda femoral, popliteal and calf veins including the posterior tibial, peroneal and gastrocnemius veins when visible. The superficial great saphenous vein was also interrogated. Spectral Doppler was utilized to evaluate flow at rest and with distal augmentation maneuvers in the common femoral, femoral and popliteal veins. COMPARISON:  None. FINDINGS: Contralateral Common Femoral Vein: Respiratory phasicity is normal  and symmetric with the symptomatic side. No evidence of thrombus. Normal compressibility. Common Femoral Vein: No evidence of thrombus. Normal compressibility, respiratory phasicity and response to augmentation. Saphenofemoral Junction: No evidence of thrombus. Normal compressibility and flow on color Doppler imaging. Profunda Femoral Vein: No evidence of thrombus. Normal compressibility and flow on color Doppler imaging. Femoral Vein: There is occlusive thrombus noted throughout the left femoral vein. No flow visualized. Vessel is noncompressible. Popliteal Vein: Occlusive thrombus noted throughout the left popliteal vein which is nonocclusive. No flow visualized. Calf Veins: Occlusive thrombus continues into the posterior tibial veins of the left calf. Superficial Great Saphenous Vein: No evidence of thrombus. Normal compressibility. Venous Reflux:  None. Other Findings:  None. IMPRESSION: Acute DVT seen within the left femoral vein, popliteal vein and posterior tibial vein in the left calf. Electronically Signed   By: Rolm Baptise M.D.   On: 10/10/2019 19:38      IMPRESSION AND PLAN:   1.  Acute bilateral pulmonary embolism with RV strain and RV/LV ratio of 0.95 secondary to acute left lower extremity DVT, probably due to nonambulatory condition. -The patient will be admitted to stepdown unit bed. -We will continue the patient on IV heparin per protocol. -Vascular surgery consult was obtained.  I notified Dr. Lucky Cowboy about the patient. -ICU consult was also obtained.  I notified the link intensivist about the patient. -A 2D echo will be obtained for further assessment of his RV strain.  2.  Hypertension. -We will continue  amlodipine and Zestril.  3.  Type 2 diabetes mellitus. -The patient will be placed on supplement coverage with NovoLog.  4.  DVT prophylaxis. -The patient will be on IV heparin as mentioned above.   All the records are reviewed and case discussed with ED provider. The plan  of care was discussed in details with the patient (and family). I answered all questions. The patient agreed to proceed with the above mentioned plan. Further management will depend upon hospital course.   CODE STATUS: Full code  TOTAL TIME TAKING CARE OF THIS PATIENT: 55 minutes.    Hannah Beat M.D on 10/10/2019 at 9:38 PM  [facility]   From 7 PM-7 AM, contact night-coverage [email or web address]  CC: Primary care physician; Patient, No Pcp Per   Note: This dictation was prepared with Dragon dictation along with smaller phrase technology. Any transcriptional errors that result from this process are unintentional.

## 2019-10-10 NOTE — ED Triage Notes (Signed)
Pt here via EMS with c/o left leg pain, was brought over from [facility] for concerns of blood clot to left calf. Pt states his left calf has hurt for about a day. EMS placed pt on 2L per [facility] for sats 88-90%. In triage on 2L, sats 95%. Pt denies known covid. NAD. HR 134.

## 2019-10-10 NOTE — Consult Note (Signed)
Name: Ronald Arias MRN: [id number] DOB: Jun 23, 1981    ADMISSION DATE:  10/10/2019 CONSULTATION DATE:  09/14/2019  REFERRING MD :  Dr. Arville Care  CHIEF COMPLAINT:  Left leg pain &swelling  BRIEF PATIENT DESCRIPTION:  38 y.o. Male admitted 1/28 with Acute Hypoxic Respiratory Failure secondary to submassive bilateral PE.  Evidence of RV strain on CTA chest. Also found to have Acute DVT of left leg.  Vascular surgery consulted with plan for Thrombectomy on 1/29.  SIGNIFICANT EVENTS  1/28-presented to ED, awaiting bed placement in stepdown unit  STUDIES:  1/28- CTA chest>>1. Pulmonary emboli involving the left pulmonary artery and the lobar, segmental, and subsegmental branches supplying the left lung as well as involving subsegmental branches supplying the right lower lobe. There is an RV to LV ratio of 0.9 which is suggestive of right heart strain. This is consistent with at least submassive (intermediate risk) PE. The presence of right heart strain has been associated with an increased risk of morbidity and mortality. Please activate Code PE by paging [phone]. 2. Calcified pleural thickening with associated atelectasis/scarring involving the right lung which may reflect prior asbestos exposure. 3. Pleural thickening versus a small loculated pleural effusion involving the left lung. 4. Hepatic steatosis. 1/28- Bilateral venous ultrasound>>Acute DVT seen within the left femoral vein, popliteal vein and posterior tibial vein in the left calf.  CULTURES: SARS-CoV-2 PCR 1/28>> negative Influenza PCR 1/28>> negative  ANTIBIOTICS: N/A  HISTORY OF PRESENT ILLNESS:   Ronald Arias is a 39 year old male with a past medical history notable for hypertension, type 2 diabetes mellitus, and obesity who presented to [facility] ED on 10/10/2019 with complaints of left leg pain and swelling that started approximately 2 days ago.  He also reports dyspnea on exertion since Monday.  He denies cough,  wheezing, chest pain, hemoptysis, fever or chills.  No sick contacts.  He denies any recent surgeries, trauma, long travel, active cancer, or hormone therapy.  He does admit to being very sedentary and nonambulatory over the last 3 days as he did not feel well.  Upon presentation to the ED he was noted to be tachycardic with heart rate of 140, blood pressure 144/77, respiratory rate 22, and pulse oximetry 90% on 2 L nasal cannula.  High-sensitivity troponin 6, WBC 15.4, INR 1, PT 12.8, glucose 420.  His COVID-19 PCR is negative, and influenza PCR is negative.  CTA chest showed pulmonary emboli involving the left pulmonary artery and the lobar segmental and subsegmental branches line the left lung, as well as the subsegmental branches of the right lower lobe with a RV to LV ratio of 0.9 suggestive of right heart strain, consistent with submassive PE.  Left lower extremity venous Doppler revealed acute DVT in the left femoral vein popliteal vein and posterior tibial vein in the left calf.  He was placed on heparin drip.  He is being admitted to the stepdown unit by hospitalist for further work-up and treatment of acute hypoxic respiratory failure in the setting of submassive PE.  Vascular surgery was consulted, with plan for thrombectomy tomorrow morning 1/29.  PCCM is consulted for further management.  PAST MEDICAL HISTORY :   has a past medical history of Hypertension.  has a past surgical history that includes none. Prior to Admission medications   Medication Sig Start Date End Date Taking? Authorizing Provider  amLODipine (NORVASC) 10 MG tablet Take 1 tablet (10 mg total) by mouth daily. 09/26/19  Yes Antonieta Iba, MD  lisinopril (ZESTRIL) 20  MG tablet Take 1 tablet (20 mg total) by mouth daily. Patient taking differently: Take 20 mg by mouth 2 (two) times daily.  09/26/19  Yes Gollan, Kathlene November, MD  Multiple Vitamin (MULTIVITAMIN WITH MINERALS) TABS tablet Take 1 tablet by mouth daily.   Yes  [provider]   No Known Allergies  FAMILY HISTORY:  family history includes Cancer in his mother; Crohn's disease in his brother; Diabetes in his father; Drug abuse in his brother; Hypertension in his father. SOCIAL HISTORY:  reports that he quit smoking about 3 years ago. His smoking use included cigarettes. He smoked 0.50 packs per day. He has never used smokeless tobacco. He reports previous alcohol use. He reports that he does not use drugs.   COVID-19 DISASTER DECLARATION:  FULL CONTACT PHYSICAL EXAMINATION WAS NOT POSSIBLE DUE TO TREATMENT OF COVID-19 AND  CONSERVATION OF PERSONAL PROTECTIVE EQUIPMENT, LIMITED EXAM FINDINGS INCLUDE-  Patient assessed or the symptoms described in the history of present illness.  In the context of the Global COVID-19 pandemic, which necessitated consideration that the patient might be at risk for infection with the SARS-CoV-2 virus that causes COVID-19, Institutional protocols and algorithms that pertain to the evaluation of patients at risk for COVID-19 are in a state of rapid change based on information released by regulatory bodies including the CDC and federal and state organizations. These policies and algorithms were followed during the patient's care while in hospital.  REVIEW OF SYSTEMS:   Positives in BOLD Constitutional: Negative for fever, chills, weight loss, +malaise/fatigue and diaphoresis.  HENT: Negative for hearing loss, ear pain, nosebleeds, congestion, sore throat, neck pain, tinnitus and ear discharge.   Eyes: Negative for blurred vision, double vision, photophobia, pain, discharge and redness.  Respiratory: Negative for cough, hemoptysis, sputum production, + dyspnea on exertion, shortness of breath, wheezing and stridor.   Cardiovascular: Negative for chest pain, palpitations, orthopnea, claudication, +leg swelling, +Left leg pain and PND.  Gastrointestinal: Negative for heartburn, nausea, vomiting, abdominal pain,  diarrhea, constipation, blood in stool and melena.  Genitourinary: Negative for dysuria, urgency, frequency, hematuria and flank pain.  Musculoskeletal: Negative for myalgias, back pain, joint pain and falls.  Skin: Negative for itching and rash.  Neurological: Negative for dizziness, tingling, tremors, sensory change, speech change, focal weakness, seizures, loss of consciousness, weakness and headaches.  Endo/Heme/Allergies: Negative for environmental allergies and polydipsia. Does not bruise/bleed easily.  SUBJECTIVE:  He reports dyspnea on exertion and pain to the left lower extremity He denies chest pain, cough, hemoptysis, fever/chills On nasal cannula  VITAL SIGNS: Temp:  [98.5 F (36.9 C)-98.9 F (37.2 C)] 98.5 F (36.9 C) (01/28 1607) Pulse Rate:  [94-140] 94 (01/28 2030) Resp:  [15-22] 16 (01/28 2030) BP: (112-144)/(70-78) 112/70 (01/28 2030) SpO2:  [90 %-98 %] 96 % (01/28 2030) Weight:  [136.1 kg] 136.1 kg (01/28 1611)  PHYSICAL EXAMINATION: General: Acutely ill-appearing obese male, sitting in bed, on nasal cannula, no acute distress Neuro: Awake, alert and oriented x4, follows commands, no focal deficits, speech clear HEENT: Atraumatic, normocephalic, neck supple, no JVD, pupils PERRLA Cardiovascular: Tachycardia, regular rhythm, S1-S2, no murmurs, rubs or gallops Lungs: Clear to auscultation bilaterally, no wheezing, even, nonlabored Abdomen: Obese, soft, nontender, no guarding or rebound tenderness, bowel sounds positive x4 Musculoskeletal: Normal bulk and tone, no deformities, left lower extremity swollen Skin: Warm and dry, no obvious rashes lesions or ulcerations  Recent Labs  Lab 10/10/19 1614  NA 129*  K 4.4  CL 96*  CO2  20*  BUN 14  CREATININE 0.86  GLUCOSE 420*   Recent Labs  Lab 10/10/19 1614  HGB 15.5  HCT 47.7  WBC 15.4*  PLT 247   CT Angio Chest PE W and/or Wo Contrast  Result Date: 10/10/2019 CLINICAL DATA:  Shortness of breath EXAM: CT  ANGIOGRAPHY CHEST WITH CONTRAST TECHNIQUE: Multidetector CT imaging of the chest was performed using the standard protocol during bolus administration of intravenous contrast. Multiplanar CT image reconstructions and MIPs were obtained to evaluate the vascular anatomy. CONTRAST:  OMNIPAQUE IOHEXOL 350 MG/ML SOLN COMPARISON:  Chest radiograph dated 09/09/2016. FINDINGS: Cardiovascular: Satisfactory opacification of the pulmonary arteries to the segmental level. Pulmonary emboli are seen involving the left pulmonary artery and the lobar, segmental, and subsegmental branches supplying the left lung as well as involving subsegmental branches supplying the right lower lobe. The internal diameter of the right ventricle is 4.4 cm compared to 4.8 of the left ventricle in the axial plane (RV to LV ratio equals 0.9), suggestive of right heart strain. The heart is mildly enlarged. No pericardial effusion. Mediastinum/Nodes: No enlarged mediastinal, hilar, or axillary lymph nodes. Thyroid gland, trachea, and esophagus demonstrate no significant findings. Lungs/Pleura: Paraseptal emphysema is noted. There is no focal consolidation or pneumothorax. Atelectasis/scarring is seen in the posterior right lung and in the left lower lobe. There is pleural thickening with calcification involving the right lung. A loculated pleural effusion versus pleural thickening is seen in the lateral left lung. Upper Abdomen: There is hepatic steatosis. Musculoskeletal: No chest wall abnormality. No acute or significant osseous findings. Review of the MIP images confirms the above findings. IMPRESSION: 1. Pulmonary emboli involving the left pulmonary artery and the lobar, segmental, and subsegmental branches supplying the left lung as well as involving subsegmental branches supplying the right lower lobe. There is an RV to LV ratio of 0.9 which is suggestive of right heart strain. This is consistent with at least submassive (intermediate risk)  PE. The presence of right heart strain has been associated with an increased risk of morbidity and mortality. Please activate Code PE by paging [phone]. 2. Calcified pleural thickening with associated atelectasis/scarring involving the right lung which may reflect prior asbestos exposure. 3. Pleural thickening versus a small loculated pleural effusion involving the left lung. 4. Hepatic steatosis. Emphysema (ICD10-J43.9). These results were called by telephone at the time of interpretation on 10/10/2019 at 7:00 pm to provider Dr. Colon Branch, who verbally acknowledged these results. Electronically Signed   By: Romona Curls M.D.   On: 10/10/2019 19:08   US Venous Img Lower Unilateral Left (DVT)  Result Date: 10/10/2019 CLINICAL DATA:  Left leg pain, swelling EXAM: LEFT LOWER EXTREMITY VENOUS DOPPLER ULTRASOUND TECHNIQUE: Gray-scale sonography with graded compression, as well as color Doppler and duplex ultrasound were performed to evaluate the lower extremity deep venous systems from the level of the common femoral vein and including the common femoral, femoral, profunda femoral, popliteal and calf veins including the posterior tibial, peroneal and gastrocnemius veins when visible. The superficial great saphenous vein was also interrogated. Spectral Doppler was utilized to evaluate flow at rest and with distal augmentation maneuvers in the common femoral, femoral and popliteal veins. COMPARISON:  None. FINDINGS: Contralateral Common Femoral Vein: Respiratory phasicity is normal and symmetric with the symptomatic side. No evidence of thrombus. Normal compressibility. Common Femoral Vein: No evidence of thrombus. Normal compressibility, respiratory phasicity and response to augmentation. Saphenofemoral Junction: No evidence of thrombus. Normal compressibility and flow on color Doppler imaging.  Profunda Femoral Vein: No evidence of thrombus. Normal compressibility and flow on color Doppler imaging. Femoral Vein:  There is occlusive thrombus noted throughout the left femoral vein. No flow visualized. Vessel is noncompressible. Popliteal Vein: Occlusive thrombus noted throughout the left popliteal vein which is nonocclusive. No flow visualized. Calf Veins: Occlusive thrombus continues into the posterior tibial veins of the left calf. Superficial Great Saphenous Vein: No evidence of thrombus. Normal compressibility. Venous Reflux:  None. Other Findings:  None. IMPRESSION: Acute DVT seen within the left femoral vein, popliteal vein and posterior tibial vein in the left calf. Electronically Signed   By: Charlett Nose M.D.   On: 10/10/2019 19:38    ASSESSMENT / PLAN:  Acute Hypoxic Respiratory Failure secondary to Submassive Bilateral PE (large left sided PE and right subsegmental) with evidence of right heart strain on CTA; likely secondary to immobility -Continuous cardiac monitoring -Supplemental O2 as needed to maintain O2 sats >92% -Follow intermittent chest x-ray and ABG as needed -Continue heparin drip -Troponin negative x2 -Patient currently normotensive -Vascular surgery consulted, tentative plan for thrombectomy on 1/29 -2D echocardiogram pending  Left lower extremity DVT -Continue heparin drip -Vascular surgery consulted  Diabetes Mellitus -CBGs -Sliding scale insulin -Follow ICU hypo/hyperglycemia protocol           Disposition: Stepdown Goals of care: Full code VTE prophylaxis/anticoagulation: Heparin drip Updates: Updated patient at bedside 10/11/2019  Harlon Ditty, [facility] Pager: [phone]   10/10/2019, 9:10 PM

## 2019-10-10 NOTE — ED Provider Notes (Addendum)
[place], [place]   Name: Ronald Arias DOB: May 28, 1981 MRN: [id number] CSN: [id number] PCP: Patient, No Pcp Per  Arrival date and time:  10/10/19 1413  Chief Complaint:  Leg Swelling   NOTE: Prior to seeing the patient today, I have reviewed the triage nursing documentation and vital signs. Clinical staff has updated patient's PMH/PSHx, current medication list, and drug allergies/intolerances to ensure comprehensive history available to assist in medical decision making.   History:   HPI: Ronald Arias is a 39 y.o. male who presents today with complaints of pain and swelling in his LEFT lower extremity that started with acute onset yesterday. Patient woke up with an acute exacerbation of his symptoms this morning. Patient denies trauma/injury. He has not an recent extended travel, surgeries, known malignancies, or extended periods of immobility. Pain is described as an intense burning sensation and feeling like he has been kicked in the leg. Pain exacerbated by ambulation. He denies any associated chest pain or palpitations. He does confirm shortness of breath, however notes that it is mainly exertional. Patient attributes exertional dyspnea to him being overweight. Patient has a PMH (+) for HTN only. He denies history of any known coagulopathies. He has never had a DVT/PE. Patient is not on daily ASA therapy. Former smoker; currently vapes. No ETOH or drug use.  Patient presents to clinic today TACHYcardic to the 140s. He was found to be HYPOxic at rest with oxygen saturations as low as 88% on room air. Patient is very anxious.   Past Medical History:  Diagnosis Date  . Hypertension     Past Surgical History:  Procedure Laterality Date  . none      Family History  Problem Relation Age of Onset  . Cancer Mother        breast  . Diabetes Father   . Hypertension Father   . Crohn's disease Brother   . Drug abuse Brother     Social History   Tobacco Use  . Smoking status:  Former Smoker    Packs/day: 0.50    Types: Cigarettes    Quit date: 06/07/2016    Years since quitting: 3.3  . Smokeless tobacco: Never Used  . Tobacco comment: E-cigarettes  Substance Use Topics  . Alcohol use: Not Currently    Comment: 12 pack of beer per week.   . Drug use: No    Patient Active Problem List   Diagnosis Date Noted  . Pulmonary embolism (HCC) 10/10/2019  . Acute pulmonary embolism (HCC) 10/10/2019  . Chronic chest pain 07/25/2017  . Type 2 diabetes mellitus without complication, without long-term current use of insulin (HCC) 07/25/2017  . Dizziness and giddiness 07/09/2016  . Vitamin D deficiency 03/05/2015  . Obesity (BMI 30-39.9) 03/04/2015  . Smoker 03/04/2015  . Prediabetes 03/04/2015  . Hx of opioid abuse (HCC) 03/04/2015  . FH: diabetes mellitus 03/04/2015  . FH: Crohn's disease 03/04/2015  . HTN (hypertension) 02/21/2015    Home Medications:    Current Meds  Medication Sig  . amLODipine (NORVASC) 10 MG tablet Take 1 tablet (10 mg total) by mouth daily.  Marland Kitchen lisinopril (ZESTRIL) 20 MG tablet Take 1 tablet (20 mg total) by mouth daily. (Patient taking differently: Take 20 mg by mouth 2 (two) times daily. )  . [DISCONTINUED] albuterol (PROVENTIL HFA;VENTOLIN HFA) 108 (90 Base) MCG/ACT inhaler Inhale 1-2 puffs into the lungs every 6 (six) hours as needed for wheezing or shortness of breath. (Patient taking differently: Inhale 1-2 puffs into the  lungs every 6 (six) hours as needed for wheezing or shortness of breath. )    Allergies:   Patient has no known allergies.  Review of Systems (ROS): Review of Systems  Constitutional: Negative for chills and fever.  Respiratory: Positive for shortness of breath. Negative for cough.   Cardiovascular: Positive for leg swelling. Negative for chest pain and palpitations.  Gastrointestinal: Negative for nausea and vomiting.  Musculoskeletal: Negative for back pain.  Skin: Negative for color change, pallor and rash.   Neurological: Negative for dizziness, syncope, light-headedness and headaches.  All other systems reviewed and are negative.    Vital Signs: Today's Vitals   10/10/19 1436 10/10/19 1438 10/10/19 1528  BP:  (!) 144/77   Pulse:  (!) 140   Resp:  (!) 22   Temp:  98.9 F (37.2 C)   TempSrc:  Oral   SpO2:  90%   Weight: 300 lb (136.1 kg)    Height: 5\' 11"  (1.803 m)    PainSc: 8   8     Physical Exam: Physical Exam  Constitutional: He is oriented to person, place, and time and well-developed, well-nourished, and in no distress.  HENT:  Head: Normocephalic and atraumatic.  Eyes: Pupils are equal, round, and reactive to light.  Cardiovascular: Regular rhythm, normal heart sounds and intact distal pulses. Tachycardia present.  Pulmonary/Chest: Effort normal and breath sounds normal. Tachypnea noted.  Abdominal: Soft. Normal appearance, normal aorta and bowel sounds are normal. He exhibits no distension. There is abdominal tenderness. There is no CVA tenderness.  Musculoskeletal:     Left lower leg: Swelling and tenderness present.     Comments: (+) PMS noted distally. Color, temperature, and sensation normal. Capillary refill WNL. (-) Homan's sign. (+) claudication pain with ambulation.   Neurological: He is alert and oriented to person, place, and time. Gait normal.  Skin: Skin is warm and dry. No rash noted. He is not diaphoretic.  Psychiatric: Memory, affect and judgment normal. His mood appears anxious.  Nursing note and vitals reviewed.   Urgent Care Treatments / Results:   Orders Placed This Encounter  Procedures    Supplemental oxygen @ 2L/[place]    EKG 12-Lead    Insert PIV      LABS: PLEASE NOTE: all labs that were ordered this encounter are listed, however only abnormal results are displayed. Labs Reviewed - No data to display  URGENT CARE ECG REPORT Date: 10/10/2019 Time ECG obtained: 1449 PM Rate: 131 bpm Rhythm: sinus tachycardia Axis (leads I and aVF):  normal Intervals: PR 136 ms, Qtc 578 ms ST segment and T wave changes: No evidence of ST segment elevation. Precordial T wave depression. Comparison: Similar to previous tracing obtained on 07/30/2018. Noted acute changes in precordial T waves.   RADIOLOGY: -None  PROCEDURES: Procedures  MEDICATIONS RECEIVED THIS VISIT: Medications - No data to display  PERTINENT CLINICAL COURSE NOTES/UPDATES:   Initial Impression / Assessment and Plan / Urgent Care Course:  Pertinent labs & imaging results that were available during my care of the patient were personally reviewed by me and considered in my medical decision making (see lab/imaging section of note for values and interpretations).  Devonte Migues is a 39 y.o. male who presents to [facility] today with complaints of Leg Swelling  Patient is well appearing overall in clinic today. He does not appear to be in any acute distress. Presenting symptoms (see HPI) and exam as documented above. Patient is very anxious about his  symptoms. He presents with acute unilateral lower extremity (LEFT) swelling. (+) concurrent claudication pain. He is tachycardic and hypoxic. I placed him on supplemental oxygen at 2L/[place], which he notes made him feel better; SPO2 increased to 95%. Discussed concern for LEFT lower extremity DVT and concurrent PE given his current symptom constellation. EKG shows ST at a rate of 131 bpm. There is noted deepening of his precordial T waves when comparing today's 12 lead to his last tracing obtained in 2019. This finding gives rise to concern for RIGHT heart strain caused by his probable pulmonary embolism.   Patient's presenting symptoms today are felt to require further evaluation and intervention in a setting that is capable of providing a higher level of care. Patient is going to need labs, US imaging of his lower leg to assess for DVT, CT angiography of the chest to assess for PE, and intravenous medications. I  discussed with patient that his problems today are outside of the capabilities of this outpatient urgent care setting, thus my recommendations are to have him seen in the emergency department at either [facility] or [facility]. Discussed that his condition has been deemed emergent, thus delaying further evaluation could result in such conditions as cardiopulmonary failure, myocardial infarction, and death. After discussion, patient notes that he would like to be seen at [facility]. Due to his current condition, and increased mortality risk, patient will need to go to the hospital via EMS transport; patient agrees. I secured PIV access while nursing staff called for emergent patient transport to [facility]. I advised him to prepare for admission to the hospital; he contacted his father via phone prior to EMS arrival.  Patient report called to [facility] emergency department staff; spoke with Alycia Rossetti, RN (charge nurse). Nurse was advised of patient's presenting complaints, assessment in clinic, findings from work up thus far, and plans for patient to present there for ongoing evaluation and management of his symptoms. Questions fielded. Nurse advised to return a call to [facility] with any questions or concerns pertaining to the care that [facility] received here today. Hospital staff aware that patient will be presenting to their facility via POV today.   Final Clinical Impressions / Urgent Care Diagnoses:   Final diagnoses:  Suspected venous thromboembolism (VTE)  Leg swelling  SOB (shortness of breath) on exertion  Hypoxia  Tachycardia    New Prescriptions:  [place] Controlled Substance Registry consulted? Not Applicable  No orders of the defined types were placed in this encounter.   Recommended Follow up Care:  Patient encouraged to follow up with the following provider within the specified time frame, or sooner as dictated by the severity of his symptoms. As always, he was instructed that for any  urgent/emergent care needs, he should seek care either here or in the emergency department for more immediate evaluation.  Follow-up Information    Go to  [facility] EMERGENCY DEPARTMENT.   Specialty: Emergency Medicine Why: EMS will take you emergently to [facility] for further evaluation and treatment. Anticipate admission for required treatment. Contact information: [address] [id number] [address] [place] [address] [phone]        NOTE: This note was prepared using Dragon dictation software along with smaller phrase technology. Despite my best ability to proofread, there is the potential that transcriptional errors may still occur from this process, and are completely unintentional.    Verlee Monte, NP 10/11/19 1949

## 2019-10-10 NOTE — Consult Note (Signed)
ANTICOAGULATION CONSULT NOTE - Initial Consult  Pharmacy Consult for Heparin Indication: pulmonary embolus  No Known Allergies  Patient Measurements: Height: 5\' 11"  (180.3 cm) Weight: 300 lb (136.1 kg) IBW/kg (Calculated) : 75.3 Heparin Dosing Weight: 106.7 kg  Vital Signs: Temp: 98.5 F (36.9 C) (01/28 1607) Temp Source: Oral (01/28 1607) BP: 125/78 (01/28 1607) Pulse Rate: 133 (01/28 1607)  Labs: Recent Labs    10/10/19 1614 10/10/19 1638 10/10/19 1815  HGB 15.5  --   --   HCT 47.7  --   --   PLT 247  --   --   CREATININE 0.86  --   --   TROPONINIHS  --  6 4    Estimated Creatinine Clearance: 164.1 mL/min (by C-G formula based on SCr of 0.86 mg/dL).   Medical History: Past Medical History:  Diagnosis Date  . Hypertension     Medications:  (Not in a hospital admission)  Scheduled:  Infusions:  PRN:   Assessment: Pharmacy consulted to start heparin for PE. No note of DOAC PTA. CBC stable.   Goal of Therapy:  Heparin level 0.3-0.7 units/ml Monitor platelets by anticoagulation protocol: Yes   Plan:  Give 6000 units bolus x 1 Start heparin infusion at 1650 units/hr Check anti-Xa level in 6 hours and daily while on heparin Continue to monitor H&H and platelets  10/12/19, PharmD, BCPS 10/10/2019,6:58 PM

## 2019-10-10 NOTE — ED Triage Notes (Signed)
Patient c/o left lower leg swelling and pain that started this morning. He denies injury.

## 2019-10-11 ENCOUNTER — Other Ambulatory Visit: Payer: Self-pay

## 2019-10-11 ENCOUNTER — Encounter
Admission: EM | Disposition: A | Discharge status: Home / Self Care | Payer: Self-pay | Source: Home / Self Care | Attending: Internal Medicine

## 2019-10-11 ENCOUNTER — Other Ambulatory Visit (INDEPENDENT_AMBULATORY_CARE_PROVIDER_SITE_OTHER): Payer: Self-pay | Admitting: Vascular Surgery

## 2019-10-11 ENCOUNTER — Encounter: Payer: Self-pay | Admitting: Internal Medicine

## 2019-10-11 ENCOUNTER — Inpatient Hospital Stay
Admit: 2019-10-11 | Discharge: 2019-10-11 | Disposition: A | Discharge status: Home / Self Care | Payer: BLUE CROSS/BLUE SHIELD | Attending: Family Medicine | Admitting: Family Medicine

## 2019-10-11 DIAGNOSIS — M7989 Other specified soft tissue disorders: Secondary | ICD-10-CM

## 2019-10-11 DIAGNOSIS — M79605 Pain in left leg: Secondary | ICD-10-CM

## 2019-10-11 DIAGNOSIS — I2699 Other pulmonary embolism without acute cor pulmonale: Secondary | ICD-10-CM

## 2019-10-11 HISTORY — PX: PULMONARY THROMBECTOMY: CATH118295

## 2019-10-11 LAB — BASIC METABOLIC PANEL
Anion gap: 9 (ref 5–15)
BUN: 13 mg/dL (ref 6–20)
CO2: 24 mmol/L (ref 22–32)
Calcium: 9 mg/dL (ref 8.9–10.3)
Chloride: 99 mmol/L (ref 98–111)
Creatinine, Ser: 0.73 mg/dL (ref 0.61–1.24)
GFR calc Af Amer: >60 mL/min (ref >60)
GFR calc non Af Amer: >60 mL/min (ref >60)
Glucose, Bld: 328 mg/dL — ABNORMAL HIGH (ref 70–99)
Potassium: 4 mmol/L (ref 3.5–5.1)
Sodium: 132 mmol/L — ABNORMAL LOW (ref 135–145)

## 2019-10-11 LAB — CBC
HCT: 42.2 % (ref 39.0–52.0)
Hemoglobin: 13.9 g/dL (ref 13.0–17.0)
MCH: 27.5 pg (ref 26.0–34.0)
MCHC: 32.9 g/dL (ref 30.0–36.0)
MCV: 83.6 fL (ref 80.0–100.0)
Platelets: 210 10*3/uL (ref 150–400)
RBC: 5.05 MIL/uL (ref 4.22–5.81)
RDW: 13.2 % (ref 11.5–15.5)
WBC: 12.8 10*3/uL — ABNORMAL HIGH (ref 4.0–10.5)
nRBC: 0 % (ref 0.0–0.2)

## 2019-10-11 LAB — ECHOCARDIOGRAM COMPLETE
Height: 71 in
Weight: 4800 oz

## 2019-10-11 LAB — HEPARIN LEVEL (UNFRACTIONATED)
Heparin Unfractionated: 0.43 IU/mL (ref 0.30–0.70)
Heparin Unfractionated: 0.24 IU/mL — ABNORMAL LOW (ref 0.30–0.70)
Heparin Unfractionated: 0.28 IU/mL — ABNORMAL LOW (ref 0.30–0.70)

## 2019-10-11 LAB — GLUCOSE, CAPILLARY
Glucose-Capillary: 207 mg/dL — ABNORMAL HIGH (ref 70–99)
Glucose-Capillary: 213 mg/dL — ABNORMAL HIGH (ref 70–99)
Glucose-Capillary: 273 mg/dL — ABNORMAL HIGH (ref 70–99)
Glucose-Capillary: 302 mg/dL — ABNORMAL HIGH (ref 70–99)
Glucose-Capillary: 339 mg/dL — ABNORMAL HIGH (ref 70–99)
Glucose-Capillary: 402 mg/dL — ABNORMAL HIGH (ref 70–99)
Glucose-Capillary: 408 mg/dL — ABNORMAL HIGH (ref 70–99)

## 2019-10-11 LAB — BRAIN NATRIURETIC PEPTIDE: B Natriuretic Peptide: 23 pg/mL (ref 0.0–100.0)

## 2019-10-11 LAB — HEMOGLOBIN A1C
Hgb A1c MFr Bld: 13.6 % — ABNORMAL HIGH (ref 4.8–5.6)
Mean Plasma Glucose: 343.62 mg/dL

## 2019-10-11 LAB — HIV ANTIBODY (ROUTINE TESTING W REFLEX): HIV Screen 4th Generation wRfx: NONREACTIVE

## 2019-10-11 SURGERY — PULMONARY THROMBECTOMY
Anesthesia: Moderate Sedation

## 2019-10-11 MED ORDER — DIPHENHYDRAMINE HCL 50 MG/ML IJ SOLN
INTRAMUSCULAR | Status: DC | PRN
  Administered 2019-10-11: 50 mg via INTRAVENOUS

## 2019-10-11 MED ORDER — CEFAZOLIN SODIUM-DEXTROSE 2-4 GM/100ML-% IV SOLN
2.0000 g | Freq: Once | INTRAVENOUS | Status: AC
  Filled 2019-10-11: qty 100

## 2019-10-11 MED ORDER — CEFAZOLIN SODIUM-DEXTROSE 2-4 GM/100ML-% IV SOLN
INTRAVENOUS | Status: AC
  Administered 2019-10-11: 2 g via INTRAVENOUS
  Filled 2019-10-11: qty 100

## 2019-10-11 MED ORDER — PERFLUTREN LIPID MICROSPHERE
1.0000 mL | INTRAVENOUS | Status: AC | PRN
  Administered 2019-10-11: 2 mL via INTRAVENOUS
  Filled 2019-10-11: qty 10

## 2019-10-11 MED ORDER — AMLODIPINE BESYLATE 10 MG PO TABS
10.0000 mg | ORAL_TABLET | Freq: Every day | ORAL | Status: DC
  Administered 2019-10-11 – 2019-10-12 (×2): 10 mg via ORAL
  Filled 2019-10-11 (×3): qty 1

## 2019-10-11 MED ORDER — ONDANSETRON HCL 4 MG/2ML IJ SOLN: 4.0000 mg | Freq: Four times a day (QID) | INTRAMUSCULAR | Status: DC | PRN

## 2019-10-11 MED ORDER — LISINOPRIL 20 MG PO TABS
20.0000 mg | ORAL_TABLET | Freq: Every day | ORAL | Status: DC
  Administered 2019-10-11 – 2019-10-12 (×2): 20 mg via ORAL
  Filled 2019-10-11 (×2): qty 1

## 2019-10-11 MED ORDER — INSULIN GLARGINE 100 UNIT/ML ~~LOC~~ SOLN
10.0000 [IU] | Freq: Every day | SUBCUTANEOUS | Status: DC
  Administered 2019-10-11: 10 [IU] via SUBCUTANEOUS
  Filled 2019-10-11 (×2): qty 0.1

## 2019-10-11 MED ORDER — INFLUENZA VAC SPLIT QUAD 0.5 ML IM SUSY
0.5000 mL | PREFILLED_SYRINGE | INTRAMUSCULAR | Status: AC
  Administered 2019-10-12: 0.5 mL via INTRAMUSCULAR
  Filled 2019-10-11: qty 0.5

## 2019-10-11 MED ORDER — DIPHENHYDRAMINE HCL 50 MG/ML IJ SOLN: 50.0000 mg | Freq: Once | INTRAMUSCULAR | Status: DC | PRN

## 2019-10-11 MED ORDER — FAMOTIDINE 20 MG PO TABS: 40.0000 mg | ORAL_TABLET | Freq: Once | ORAL | Status: DC | PRN

## 2019-10-11 MED ORDER — FENTANYL CITRATE (PF) 100 MCG/2ML IJ SOLN
INTRAMUSCULAR | Status: AC
  Filled 2019-10-11: qty 2

## 2019-10-11 MED ORDER — MIDAZOLAM HCL 5 MG/5ML IJ SOLN
INTRAMUSCULAR | Status: AC
  Filled 2019-10-11: qty 5

## 2019-10-11 MED ORDER — MIDAZOLAM HCL 2 MG/2ML IJ SOLN
INTRAMUSCULAR | Status: DC | PRN
  Administered 2019-10-11: 2 mg via INTRAVENOUS
  Administered 2019-10-11 (×2): 1 mg via INTRAVENOUS

## 2019-10-11 MED ORDER — SODIUM CHLORIDE 0.9 % IV SOLN: INTRAVENOUS | Status: DC

## 2019-10-11 MED ORDER — HEPARIN BOLUS VIA INFUSION
1500.0000 [IU] | Freq: Once | INTRAVENOUS | Status: AC
  Administered 2019-10-11: 10:00:00 1500 [IU] via INTRAVENOUS
  Filled 2019-10-11: qty 1500

## 2019-10-11 MED ORDER — FENTANYL CITRATE (PF) 100 MCG/2ML IJ SOLN
INTRAMUSCULAR | Status: DC | PRN
  Administered 2019-10-11: 50 ug via INTRAVENOUS
  Administered 2019-10-11 (×2): 25 ug via INTRAVENOUS

## 2019-10-11 MED ORDER — MIDAZOLAM HCL 2 MG/ML PO SYRP: 8.0000 mg | ORAL_SOLUTION | Freq: Once | ORAL | Status: DC | PRN

## 2019-10-11 MED ORDER — SODIUM CHLORIDE FLUSH 0.9 % IV SOLN
INTRAVENOUS | Status: AC
  Filled 2019-10-11: qty 10

## 2019-10-11 MED ORDER — DIPHENHYDRAMINE HCL 50 MG/ML IJ SOLN
INTRAMUSCULAR | Status: AC
  Filled 2019-10-11: qty 1

## 2019-10-11 MED ORDER — HEPARIN SODIUM (PORCINE) 1000 UNIT/ML IJ SOLN
INTRAMUSCULAR | Status: AC
  Filled 2019-10-11: qty 1

## 2019-10-11 MED ORDER — HEPARIN BOLUS VIA INFUSION
1600.0000 [IU] | Freq: Once | INTRAVENOUS | Status: AC
  Administered 2019-10-11: 1600 [IU] via INTRAVENOUS
  Filled 2019-10-11: qty 1600

## 2019-10-11 MED ORDER — METHYLPREDNISOLONE SODIUM SUCC 125 MG IJ SOLR: 125.0000 mg | Freq: Once | INTRAMUSCULAR | Status: DC | PRN

## 2019-10-11 MED ORDER — HYDROMORPHONE HCL 1 MG/ML IJ SOLN
1.0000 mg | Freq: Once | INTRAMUSCULAR | Status: AC | PRN
  Administered 2019-10-11: 1 mg via INTRAVENOUS
  Filled 2019-10-11: qty 1

## 2019-10-11 SURGICAL SUPPLY — 12 items
CANISTER PENUMBRA ENGINE (MISCELLANEOUS) ×1 IMPLANT
CATH INDIGO SEP 8 (CATHETERS) ×1 IMPLANT
CATH INFINITI JR4 5F (CATHETERS) ×1 IMPLANT
CATH LIGHTNING 8 XTORQ 115 (CATHETERS) ×1 IMPLANT
CATH PIG 70CM (CATHETERS) ×1 IMPLANT
DEVICE SAFEGUARD 24CM (GAUZE/BANDAGES/DRESSINGS) ×1 IMPLANT
GLIDEWIRE ADV .035X260CM (WIRE) ×1 IMPLANT
PACK ANGIOGRAPHY (CUSTOM PROCEDURE TRAY) ×2 IMPLANT
SHEATH 9FRX11 (SHEATH) ×1 IMPLANT
SYR MEDRAD MARK 7 150ML (SYRINGE) ×1 IMPLANT
TUBING CONTRAST HIGH PRESS 72 (TUBING) ×1 IMPLANT
WIRE MAGIC TORQUE 260C (WIRE) ×1 IMPLANT

## 2019-10-11 NOTE — Progress Notes (Signed)
*  PRELIMINARY RESULTS* Echocardiogram 2D Echocardiogram has been performed.  Jakobi Thetford M Shyheem Whitham 08/20/2020, 9:40 AM 

## 2019-10-11 NOTE — Consult Note (Signed)
ANTICOAGULATION CONSULT NOTE - Initial Consult  Pharmacy Consult for Heparin Indication: pulmonary embolus  No Known Allergies  Patient Measurements: Height: 5\' 11"  (180.3 cm) Weight: 300 lb (136.1 kg) IBW/kg (Calculated) : 75.3 Heparin Dosing Weight: 106.7 kg  Vital Signs: BP: 123/80 (01/29 0830) Pulse Rate: 104 (01/29 0830)  Labs: Recent Labs    10/10/19 1614 10/10/19 1638 10/10/19 1815 10/10/19 1903 10/11/19 0105 10/11/19 0833  HGB 15.5  --   --   --  13.9  --   HCT 47.7  --   --   --  42.2  --   PLT 247  --   --   --  210  --   APTT  --   --   --  28  --   --   LABPROT  --   --   --  12.8  --   --   INR  --   --   --  1.0  --   --   HEPARINUNFRC  --   --   --   --  0.43 0.24*  CREATININE 0.86  --   --   --  0.73  --   TROPONINIHS  --  6 4  --   --   --     Estimated Creatinine Clearance: 176.4 mL/min (by C-G formula based on SCr of 0.73 mg/dL).   Medical History: Past Medical History:  Diagnosis Date  . Hypertension    Medications:  No PTA anticoagulation of record  Assessment: Pharmacy consulted to start heparin for PE.  CBC stable.   01/29 @ 0100 HL 0.43 therapeutic (@1650  units/hr) 01/29 @ 0833 HL 0.24 subtherapeutic (@1650  units/hr)  Goal of Therapy:  Heparin level 0.3-0.7 units/ml Monitor platelets by anticoagulation protocol: Yes   Plan:  Will give bolus 1500 units and increase current rate to 1850 units/hr and will recheck HL @ 1600 (in 6 hours) per protocol.   CBC trending down will continue to monitor.  , PharmD, BCPS Clinical Pharmacist 10/11/2019 9:14 AM

## 2019-10-11 NOTE — Consult Note (Signed)
ANTICOAGULATION CONSULT NOTE - Initial Consult  Pharmacy Consult for Heparin Indication: pulmonary embolus  No Known Allergies  Patient Measurements: Height: 5\' 11"  (180.3 cm) Weight: 300 lb (136.1 kg) IBW/kg (Calculated) : 75.3 Heparin Dosing Weight: 106.7 kg  Vital Signs: Temp: 98.2 F (36.8 C) (01/29 1323) Temp Source: Oral (01/29 1323) BP: 103/63 (01/29 1650) Pulse Rate: 82 (01/29 1650)  Labs: Recent Labs    10/10/19 1614 10/10/19 1638 10/10/19 1815 10/10/19 1903 10/11/19 0105 10/11/19 0833  HGB 15.5  --   --   --  13.9  --   HCT 47.7  --   --   --  42.2  --   PLT 247  --   --   --  210  --   APTT  --   --   --  28  --   --   LABPROT  --   --   --  12.8  --   --   INR  --   --   --  1.0  --   --   HEPARINUNFRC  --   --   --   --  0.43 0.24*  CREATININE 0.86  --   --   --  0.73  --   TROPONINIHS  --  6 4  --   --   --     Estimated Creatinine Clearance: 176.4 mL/min (by C-G formula based on SCr of 0.73 mg/dL).   Medical History: Past Medical History:  Diagnosis Date  . Hypertension    Medications:  No PTA anticoagulation of record  Assessment: Pharmacy consulted to start heparin for PE.  CBC stable.   01/29 @ 0100 HL 0.43 therapeutic (@1650  units/hr) 01/29 @ 0833 HL 0.24 subtherapeutic (@1650  units/hr)  Goal of Therapy:  Heparin level 0.3-0.7 units/ml Monitor platelets by anticoagulation protocol: Yes   Plan:  -Patient went for pulmonary thrombectomy at 1400. Heparin drip off for procedure. Infusion re-started at previous rate of 1850 units/hr with no bolus at 1515. Re-timed heparin level for 6 hours after re-start of infusion.  CBC trending down will continue to monitor.  Pharmacy Resident 10/11/2019 4:59 PM

## 2019-10-11 NOTE — Consult Note (Signed)
ANTICOAGULATION CONSULT NOTE - Initial Consult  Pharmacy Consult for Heparin Indication: pulmonary embolus  No Known Allergies  Patient Measurements: Height: 5\' 11"  (180.3 cm) Weight: 300 lb (136.1 kg) IBW/kg (Calculated) : 75.3 Heparin Dosing Weight: 106.7 kg  Vital Signs: Temp: 98.5 F (36.9 C) (01/28 1607) Temp Source: Oral (01/28 1607) BP: 133/78 (01/29 0100) Pulse Rate: 106 (01/29 0100)  Labs: Recent Labs    10/10/19 1614 10/10/19 1638 10/10/19 1815 10/10/19 1903 10/11/19 0105  HGB 15.5  --   --   --  13.9  HCT 47.7  --   --   --  42.2  PLT 247  --   --   --  210  APTT  --   --   --  28  --   LABPROT  --   --   --  12.8  --   INR  --   --   --  1.0  --   HEPARINUNFRC  --   --   --   --  0.43  CREATININE 0.86  --   --   --  0.73  TROPONINIHS  --  6 4  --   --     Estimated Creatinine Clearance: 176.4 mL/min (by C-G formula based on SCr of 0.73 mg/dL).   Medical History: Past Medical History:  Diagnosis Date  . Hypertension     Medications:  (Not in a hospital admission)  Scheduled:  Infusions:  PRN:   Assessment: Pharmacy consulted to start heparin for PE. No note of DOAC PTA. CBC stable.   Goal of Therapy:  Heparin level 0.3-0.7 units/ml Monitor platelets by anticoagulation protocol: Yes   Plan:  01/29 @ 0100 HL 0.43 therapeutic. Will continue current rate and will recheck HL @ 0700, CBC trending down will continue to monitor.  2/29, PharmD, BCPS 10/11/2019,1:27 AM

## 2019-10-11 NOTE — ED Notes (Signed)
Pt states having left calf swelling and pain and feeling shob.

## 2019-10-11 NOTE — Progress Notes (Signed)
Inpatient Diabetes Program Recommendations  AACE/ADA: New Consensus Statement on Inpatient Glycemic Control (2015)  Target Ranges:  Prepandial:   less than 140 mg/dL      Peak postprandial:   less than 180 mg/dL (1-2 hours)      Critically ill patients:  140 - 180 mg/dL   Results for NIALL, ILLES (MRN [id number]) as of 10/11/2019 07:38  Ref. Range 10/10/2019 22:16 10/11/2019 00:16 10/11/2019 04:32  Glucose-Capillary Latest Ref Range: 70 - 99 mg/dL 206 (H)  11 units NOVOLOG  339 (H)  11 units NOVOLOG  213 (H)  5 units NOVOLOG    Results for JARAD, BARTH (MRN [id number]) as of 10/11/2019 07:38  Ref. Range 10/11/2019 00:04  Hemoglobin A1C Latest Ref Range: 4.8 - 5.6 % 13.6 (H)  (343 mg/dl)    Admit with: Acute bilateral pulmonary embolism with RV strain secondary to LE DVT  History: DM, HTN  Home DM Meds: None listed  Current Orders: Novolog Moderate Correction Scale/ SSI (0-15 units) Q4 hours    NPO this AM.  Current A1c of 13.6% shows extremely poor glucose control at home--No Diabetes meds listed.  PCP??  Plan to speak with patient to discuss current elevated A1c and DM care regimen at home.      --Will follow patient during hospitalization--  Ambrose Finland RN, MSN, CDE Diabetes Coordinator Inpatient Glycemic Control Team Team Pager: [phone] (8a-5p)

## 2019-10-11 NOTE — ED Notes (Signed)
Attempted to call floor for pt. RN Charge to take a look and will call back.

## 2019-10-11 NOTE — ED Notes (Addendum)
Admit MD at bedside

## 2019-10-11 NOTE — ED Notes (Signed)
Surgery at bedside.

## 2019-10-11 NOTE — Consult Note (Signed)
[facility] Vascular Consult Note  MRN : [id number]  Ronald Arias is a 39 y.o. (August 12, 1981) male who presents with chief complaint of  Chief Complaint  Patient presents with  . Leg Pain   History of Present Illness:  The patient is a 39 year old male with a past medical history of hypertension, obesity, active tobacco abuse, diabetes type 2, history of opioid abuse, vitamin D deficiency who presented to the [facility] emergency department with a chief complaint of left lower extremity discomfort.  The patient endorses a history of being [facility] for progressively worsening left lower extremity pain. Once examined, EMS was called and the patient was transferred to [facility] emergency department for continued care over the concern of possible left lower extremity DVT.   Patient notes his symptoms started acutely yesterday. His symptoms are progressively worsening left lower extremity pain and swelling. Does describe the pain as a "burning". During work-up in the emergency department the patient was noted to be hypoxic at 88% on room air which he attributed to his obesity. 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  The patient was found to have an extensive left lower extremity DVT and left lower lobe pulmonary emboli with right heart strain. The patient was admitted and heparin drip was initiated.  Vascular surgery was consulted by Dr. Arville Care for possible thrombolysis / thrombectomy.  Current Facility-Administered Medications  Medication Dose Route Frequency Provider Last Rate Last Admin  . 0.9 %  sodium chloride infusion   Intravenous Continuous Charletta Voight A, PA-C 75 mL/hr at 10/11/19 0832 Rate Verify at 10/11/19 0832   . acetaminophen (TYLENOL) tablet 650 mg  650 mg Oral Q4H PRN Judithe Modest, NP   650 mg at 10/11/19 0502  . amLODipine (NORVASC) tablet 10 mg  10 mg Oral Daily Albina Billet, [facility]      . heparin ADULT infusion 100 units/mL (25000 units/219mL sodium chloride 0.45%)  1,850 Units/hr Intravenous Continuous Albina Billet, RPH 18.5 mL/hr at 10/11/19 1006 1,850 Units/hr at 10/11/19 1006  . insulin aspart (novoLOG) injection 0-15 Units  0-15 Units Subcutaneous Q4H Harlon Ditty D, NP   5 Units at 10/11/19 [phone]  . lisinopril (ZESTRIL) tablet 20 mg  20 mg Oral Daily Shanlever, Charmayne Sheer, RPH      . multivitamin with minerals tablet 1 tablet  1 tablet Oral Daily Harlon Ditty D, NP   1 tablet at 10/10/19 2221  . ondansetron (ZOFRAN) injection 4 mg  4 mg Intravenous Q6H PRN Judithe Modest, NP       Current Outpatient Medications  Medication Sig Dispense Refill  . amLODipine (NORVASC) 10 MG tablet Take 1 tablet (10 mg total) by mouth daily. 90 tablet 0  . lisinopril (ZESTRIL) 20 MG tablet Take 1 tablet (20 mg total) by mouth daily. (Patient taking differently: Take 20 mg by mouth 2 (two) times daily. ) 90 tablet 0  . Multiple Vitamin (MULTIVITAMIN WITH MINERALS) TABS tablet Take 1 tablet by mouth daily.     Past Medical History:  Diagnosis Date  . Hypertension    Past Surgical History:  Procedure Laterality Date  . none     Social History Social History   Tobacco Use  . Smoking status: Former Smoker  Packs/day: 0.50    Types: Cigarettes    Quit date: 06/07/2016    Years since quitting: 3.3  . Smokeless tobacco: Never Used  . Tobacco comment: E-cigarettes  Substance Use Topics  . Alcohol use: Not Currently    Comment: 12 pack of beer per week.   . Drug use: No   Family History Family History  Problem Relation Age of Onset  . Cancer Mother        breast  . Diabetes Father   . Hypertension Father   . Crohn's disease Brother   . Drug abuse Brother   Denies  family history of peripheral artery disease, venous disease or bleeding/clotting disorders.  No Known Allergies  REVIEW OF SYSTEMS (Negative unless checked)  Constitutional: [] Weight loss  [] Fever  [] Chills Cardiac: [] Chest pain   [] Chest pressure   [] Palpitations   [] Shortness of breath when laying flat   [] Shortness of breath at rest   [] Shortness of breath with exertion. Vascular:  [x] Pain in legs with walking   [x] Pain in legs at rest   [] Pain in legs when laying flat   [] Claudication   [] Pain in feet when walking  [] Pain in feet at rest  [] Pain in feet when laying flat   [] History of DVT   [] Phlebitis   [x] Swelling in legs   [] Varicose veins   [] Non-healing ulcers Pulmonary:   [] Uses home oxygen   [] Productive cough   [] Hemoptysis   [] Wheeze  [] COPD   [] Asthma Neurologic:  [] Dizziness  [] Blackouts   [] Seizures   [] History of stroke   [] History of TIA  [] Aphasia   [] Temporary blindness   [] Dysphagia   [] Weakness or numbness in arms   [] Weakness or numbness in legs Musculoskeletal:  [] Arthritis   [] Joint swelling   [] Joint pain   [] Low back pain Hematologic:  [] Easy bruising  [] Easy bleeding   [] Hypercoagulable state   [] Anemic  [] Hepatitis Gastrointestinal:  [] Blood in stool   [] Vomiting blood  [] Gastroesophageal reflux/heartburn   [] Difficulty swallowing. Genitourinary:  [] Chronic kidney disease   [] Difficult urination  [] Frequent urination  [] Burning with urination   [] Blood in urine Skin:  [] Rashes   [] Ulcers   [] Wounds Psychological:  [] History of anxiety   []  History of major depression.  Physical Examination  Vitals:   10/11/19 1030 10/11/19 1045 10/11/19 1100 10/11/19 1130  BP: 116/72  129/64 126/71  Pulse: 72 68 72 70  Resp: 17 16 16 12   Temp:      TempSrc:      SpO2: 97% 97% 96% 96%  Weight:      Height:       Body mass index is 41.84 kg/m. Gen:  WD/WN, NAD, On nasal cannula Head: [place]/AT, No temporalis wasting. Prominent temp pulse not noted. Ear/Nose/Throat: Hearing  grossly intact, nares w/o erythema or drainage, oropharynx w/o Erythema/Exudate Eyes: Sclera non-icteric, conjunctiva clear Neck: Trachea midline.  No JVD.  Pulmonary:  Good air movement, respirations not labored, equal bilaterally.  Cardiac: RRR, normal S1, S2. Vascular:  Vessel Right Left  Radial Palpable Palpable  Ulnar Palpable Palpable  Brachial Palpable Palpable  Carotid Palpable, without bruit Palpable, without bruit  Aorta Not palpable N/A  Femoral Palpable Palpable  Popliteal Palpable Palpable  PT Palpable Palpable  DP Palpable Palpable   Left lower extremity: Thigh soft. Calf soft. She remains warm distally to toes. There is no acute vascular compromise noted on his physical exam. Slightly tender to palpation. Slightly tender in dorsiflexion.  Gastrointestinal: soft, non-tender/non-distended. No  guarding/reflex.  Musculoskeletal: M/S 5/5 throughout.  Extremities without ischemic changes.  No deformity or atrophy. No edema. Neurologic: Sensation grossly intact in extremities.  Symmetrical.  Speech is fluent. Motor exam as listed above. Psychiatric: Judgment intact, Mood & affect appropriate for pt's clinical situation. Dermatologic: No rashes or ulcers noted.  No cellulitis or open wounds. Lymph : No Cervical, Axillary, or Inguinal lymphadenopathy.  CBC Lab Results  Component Value Date   WBC 12.8 (H) 10/11/2019   HGB 13.9 10/11/2019   HCT 42.2 10/11/2019   MCV 83.6 10/11/2019   PLT 210 10/11/2019   BMET    Component Value Date/Time   NA 132 (L) 10/11/2019 0105   NA 140 03/04/2015 1540   NA 139 07/20/2014 0350   K 4.0 10/11/2019 0105   K 4.5 07/20/2014 0350   CL 99 10/11/2019 0105   CL 106 07/20/2014 0350   CO2 24 10/11/2019 0105   CO2 25 07/20/2014 0350   GLUCOSE 328 (H) 10/11/2019 0105   GLUCOSE 94 07/20/2014 0350   BUN 13 10/11/2019 0105   BUN 15 03/04/2015 1540   BUN 13 07/20/2014 0350   CREATININE 0.73 10/11/2019 0105   CREATININE 0.76 07/20/2014  0350   CALCIUM 9.0 10/11/2019 0105   CALCIUM 8.1 (L) 07/20/2014 0350   GFRNONAA >60 10/11/2019 0105   GFRNONAA >60 07/20/2014 0350   GFRAA >60 10/11/2019 0105   GFRAA >60 07/20/2014 0350   Estimated Creatinine Clearance: 176.4 mL/min (by C-G formula based on SCr of 0.73 mg/dL).  COAG Lab Results  Component Value Date   INR 1.0 10/10/2019   Radiology CT Angio Chest PE W and/or Wo Contrast  Result Date: 10/10/2019 CLINICAL DATA:  Shortness of breath EXAM: CT ANGIOGRAPHY CHEST WITH CONTRAST TECHNIQUE: Multidetector CT imaging of the chest was performed using the standard protocol during bolus administration of intravenous contrast. Multiplanar CT image reconstructions and MIPs were obtained to evaluate the vascular anatomy. CONTRAST:  OMNIPAQUE IOHEXOL 350 MG/ML SOLN COMPARISON:  Chest radiograph dated 09/09/2016. FINDINGS: Cardiovascular: Satisfactory opacification of the pulmonary arteries to the segmental level. Pulmonary emboli are seen involving the left pulmonary artery and the lobar, segmental, and subsegmental branches supplying the left lung as well as involving subsegmental branches supplying the right lower lobe. The internal diameter of the right ventricle is 4.4 cm compared to 4.8 of the left ventricle in the axial plane (RV to LV ratio equals 0.9), suggestive of right heart strain. The heart is mildly enlarged. No pericardial effusion. Mediastinum/Nodes: No enlarged mediastinal, hilar, or axillary lymph nodes. Thyroid gland, trachea, and esophagus demonstrate no significant findings. Lungs/Pleura: Paraseptal emphysema is noted. There is no focal consolidation or pneumothorax. Atelectasis/scarring is seen in the posterior right lung and in the left lower lobe. There is pleural thickening with calcification involving the right lung. A loculated pleural effusion versus pleural thickening is seen in the lateral left lung. Upper Abdomen: There is hepatic steatosis. Musculoskeletal: No  chest wall abnormality. No acute or significant osseous findings. Review of the MIP images confirms the above findings. IMPRESSION: 1. Pulmonary emboli involving the left pulmonary artery and the lobar, segmental, and subsegmental branches supplying the left lung as well as involving subsegmental branches supplying the right lower lobe. There is an RV to LV ratio of 0.9 which is suggestive of right heart strain. This is consistent with at least submassive (intermediate risk) PE. The presence of right heart strain has been associated with an increased risk of morbidity and mortality. Please  activate Code PE by paging [phone]. 2. Calcified pleural thickening with associated atelectasis/scarring involving the right lung which may reflect prior asbestos exposure. 3. Pleural thickening versus a small loculated pleural effusion involving the left lung. 4. Hepatic steatosis. Emphysema (ICD10-J43.9). These results were called by telephone at the time of interpretation on 10/10/2019 at 7:00 pm to provider Dr. Joan Mayans, who verbally acknowledged these results. Electronically Signed   By: Zerita Boers M.D.   On: 10/10/2019 19:08   US Venous Img Lower Unilateral Left (DVT)  Result Date: 10/10/2019 CLINICAL DATA:  Left leg pain, swelling EXAM: LEFT LOWER EXTREMITY VENOUS DOPPLER ULTRASOUND TECHNIQUE: Gray-scale sonography with graded compression, as well as color Doppler and duplex ultrasound were performed to evaluate the lower extremity deep venous systems from the level of the common femoral vein and including the common femoral, femoral, profunda femoral, popliteal and calf veins including the posterior tibial, peroneal and gastrocnemius veins when visible. The superficial great saphenous vein was also interrogated. Spectral Doppler was utilized to evaluate flow at rest and with distal augmentation maneuvers in the common femoral, femoral and popliteal veins. COMPARISON:  None. FINDINGS: Contralateral Common Femoral  Vein: Respiratory phasicity is normal and symmetric with the symptomatic side. No evidence of thrombus. Normal compressibility. Common Femoral Vein: No evidence of thrombus. Normal compressibility, respiratory phasicity and response to augmentation. Saphenofemoral Junction: No evidence of thrombus. Normal compressibility and flow on color Doppler imaging. Profunda Femoral Vein: No evidence of thrombus. Normal compressibility and flow on color Doppler imaging. Femoral Vein: There is occlusive thrombus noted throughout the left femoral vein. No flow visualized. Vessel is noncompressible. Popliteal Vein: Occlusive thrombus noted throughout the left popliteal vein which is nonocclusive. No flow visualized. Calf Veins: Occlusive thrombus continues into the posterior tibial veins of the left calf. Superficial Great Saphenous Vein: No evidence of thrombus. Normal compressibility. Venous Reflux:  None. Other Findings:  None. IMPRESSION: Acute DVT seen within the left femoral vein, popliteal vein and posterior tibial vein in the left calf. Electronically Signed   By: Rolm Baptise M.D.   On: 10/10/2019 19:38   Assessment/Plan The patient is a 39 year old male with a past medical history of hypertension, obesity, active tobacco abuse, diabetes type 2, history of opioid abuse, vitamin D deficiency who presented to the [facility] emergency department with a chief complaint of left lower extremity discomfort. Found to have left lower extremity DVT and PE with right heart strain. 1. Pulmonary embolism: Patient with large left lobe pulmonary embolus with right heart strain noted. Patient was hypoxic, satting 80% on room air. Even with oxygen supplementation, the patient continues to feel significantly short of breath. In the setting of pulmonary embolus with right heart strain, hypoxemia, continued shortness of breath after initiation of heparin would recommend pulmonary thrombectomy/lysis and attempt to  reduce the clot burden, less than cardiac stress and improve symptoms. Procedure, risks and benefit explained to the patient. All questions answered. The patient wishes to proceed. Will most likely be on Eliquis approximately one year. 2. Left lower extremity DVT: Patient with an extensive left lower extremity DVT. Due to its extensive nature and subsequent pulmonary embolism patient would benefit from venogram to rule out May Thurner syndrome as well as venous thrombectomy/lysis. Procedure, risks and benefits went to the patient. All questions answered. Wish to proceed. Continue heparin drip and transition to Eliquis most likely tomorrow. 3. Tobacco abuse: We had a discussion for approximately three minutes regarding the absolute need for smoking cessation  due to the deleterious nature of tobacco on the vascular system. We discussed the tobacco use would diminish patency of any intervention, and likely significantly worsen progressio of disease. We discussed multiple agents for quitting including replacement therapy or medications to reduce cravings such as Chantix. The patient voices their understanding of the importance of smoking cessation.  Dicussed with Dr. Weldon Inchesew  Deaundre Allston A Yulia Ulrich, PA-C  10/11/2019 11:38 AM  This note was created with Dragon medical transcription system.  Any error is purely unintentional

## 2019-10-11 NOTE — Consult Note (Signed)
ANTICOAGULATION CONSULT NOTE - Initial Consult  Pharmacy Consult for Heparin Indication: pulmonary embolus  No Known Allergies  Patient Measurements: Height: 5\' 11"  (180.3 cm) Weight: 300 lb (136.1 kg) IBW/kg (Calculated) : 75.3 Heparin Dosing Weight: 106.7 kg  Vital Signs: Temp: 98.2 F (36.8 C) (01/29 1944) Temp Source: Oral (01/29 1944) BP: 121/69 (01/29 1944) Pulse Rate: 100 (01/29 1944)  Labs: Recent Labs    10/10/19 1614 10/10/19 1638 10/10/19 1815 10/10/19 1903 10/11/19 0105 10/11/19 0833 10/11/19 2105  HGB 15.5  --   --   --  13.9  --   --   HCT 47.7  --   --   --  42.2  --   --   PLT 247  --   --   --  210  --   --   APTT  --   --   --  28  --   --   --   LABPROT  --   --   --  12.8  --   --   --   INR  --   --   --  1.0  --   --   --   HEPARINUNFRC  --   --   --   --  0.43 0.24* 0.28*  CREATININE 0.86  --   --   --  0.73  --   --   TROPONINIHS  --  6 4  --   --   --   --     Estimated Creatinine Clearance: 176.4 mL/min (by C-G formula based on SCr of 0.73 mg/dL).   Medical History: Past Medical History:  Diagnosis Date  . Hypertension    Medications:  No PTA anticoagulation of record  Assessment: Pharmacy consulted to start heparin for PE.  CBC stable.   01/29 @ 0100 HL 0.43 therapeutic (@1650  units/hr) 01/29 @ 0833 HL 0.24 subtherapeutic (@1650  units/hr)  Goal of Therapy:  Heparin level 0.3-0.7 units/ml Monitor platelets by anticoagulation protocol: Yes   Plan:  -Patient went for pulmonary thrombectomy at 1400. Heparin drip off for procedure. Infusion re-started at previous rate of 1850 units/hr with no bolus at 1515. Re-timed heparin level for 6 hours after re-start of infusion.  1/29:  HL @ 2105 = 0.28 Will order Heparin 1600 units/hr IV x 1 bolus and increase drip rate to 2050 units/hr.  Will recheck HL 6 hrs after rate change.   CBC trending down will continue to monitor.  Aquanetta Schwarz D Pharmacy Resident 10/11/2019 9:56 PM

## 2019-10-11 NOTE — Op Note (Signed)
[facility]  Percutaneous Study/Intervention Procedural Note   Date of Surgery: 10/11/2019,3:08 PM  Surgeon: Festus Barren  Pre-operative Diagnosis: Symptomatic bilateral pulmonary emboli  Post-operative diagnosis:  Same  Procedure(s) Performed:  1.  Contrast injection right heart  2.  Thrombolysis lateral pulmonary arteries  3.  Mechanical thrombectomy with the penumbra cat 8 device to the left lower lobe, right middle lobe, and right lower lobe pulmonary arteries  4.  Selective catheter placement right middle and lower lobe pulmonary arteries  5.  Selective catheter placement left upper and lower lobe pulmonary arteries  6.  Left iliofemoral venogram    Anesthesia: Conscious sedation was administered under my direct supervision by the interventional radiology RN. IV Versed plus fentanyl were utilized. Continuous ECG, pulse oximetry and blood pressure was monitored throughout the entire procedure.  Versed and fentanyl were administered intravenously.  Conscious sedation was administered for a total of 45 minutes using 4 of Versed and 100 mcg of Fentanyl.  EBL: 400 cc  Sheath: 9 French left groin  Contrast: 80 cc   Fluoroscopy Time: 19.4 minutes  Indications:  Patient presents with pulmonary emboli. The patient is symptomatic with hypoxemia and dyspnea on exertion.  There is evidence of right heart strain on the CT angiogram. The patient is otherwise a good candidate for intervention and even the long-term benefits pulmonary angiography with thrombolysis is offered. The risks and benefits are reviewed long-term benefits are discussed. All questions are answered patient agrees to proceed.  Procedure:  Ronald Arias a 39 y.o. male who was identified and appropriate procedural time out was performed.  The patient was then placed supine on the table and prepped and draped in the usual sterile fashion.  Ultrasound was used to evaluate the left common femoral  vein.  It was patent, as it was echolucent and compressible although previous ultrasound had suggested thrombosis up to this level.  A digital ultrasound image was acquired for the permanent record.  A Seldinger needle was used to access the left common femoral vein under direct ultrasound guidance.  A 0.035 J wire was advanced without resistance and a 5Fr sheath was placed and then upsized to an 9 [place] sheath.  Imaging was performed through the 9 French sheath and no significant thrombus was seen in the left common femoral vein or iliac vein so no thrombectomy was performed in these locations.  The wire and pigtail catheter were then negotiated into the right atrium and bolus injection of contrast was utilized to demonstrate the right ventricle and the pulmonary artery outflow. The wire and catheter were then negotiated into the main pulmonary artery where hand injection of contrast was utilized to demonstrate the pulmonary arteries and confirm the locations of the pulmonary emboli.   TPA was reconstituted and delivered onto the table. A total of 10 milligrams of TPA was utilized.  5 was administered on the left side and 5 was administered on the right side. This was then allowed to dwell.  The Penumbra Cat 8 catheter was then advanced up into the pulmonary vasculature. The left lung was addressed first. Catheter was negotiated into the left lower lobe and after selective imaging showed a very large amount of thrombus mechanical thrombectomy was performed. Passes were made with both the Penumbra catheter itself as well as introducing the separator.  A large amount of thrombus was removed and left lower lobe and completion imaging showed only a small amount of subsegmental thrombus present.  Catheter was directed  into the left upper lobe where imaging was performed showed only a small amount of thrombus and it was not felt to be helpful to perform thrombectomy.  The Penumbra Cat 8 catheter was then  negotiated to the opposite side. The right lung was then addressed. Catheter was negotiated into the right middle lobe and selective imaging showed significant thrombus and mechanical thrombectomy was performed. Follow-up imaging demonstrated a good result and therefore the catheter was renegotiated into the right lower lobe pulmonary artery and again mechanical thrombectomy was performed after selective imaging showing thrombosis in the right lower lobe.  The catheter was pulled back into the right main pulmonary artery her imaging showed marked improvement in the right lower and middle lobe thromboses with only a small amount of thrombus in the right upper lobe not felt to benefit much from thrombectomy.  After review these images wires were reintroduced and the catheters removed. Then, the sheath is then pulled and pressures held. A safeguard is placed.    Findings:   Right heart imaging:  Right atrium and right ventricle and the pulmonary outflow tract appears somewhat dilated  Right lung: Thrombus in the right middle lobe and right lower lobe as well as what appeared to be a very small amount of thrombus in the right upper lobe  Left lung: Extensive thrombus in the left lower lobe and its primary branches.  Small amount of thrombus in the left upper lobe.    Disposition: Patient was taken to the recovery room in stable condition having tolerated the procedure well.  Ronald Arias 10/11/2019,3:08 PM

## 2019-10-11 NOTE — Progress Notes (Addendum)
PROGRESS NOTE    Ronald Arias  ZOX:[id number] DOB: 17-Jul-1981 DOA: 10/10/2019  PCP: Patient, No Pcp Per    LOS - 1   Brief Narrative:  Ronald Arias  is a 39 y.o. Caucasian male with a known history of hypertension and type 2 diabetes mellitus, presented to the ED on 1/28 with left leg pain and swelling that started that morning with associated dyspnea mainly on exertion with no cough or wheezing or chest pain or hemoptysis or palpitations.  Admitted to being nonambulatory over the last 3 days as he states he did not feel right.  No recent surgeries, long travel, quit smoking 3 years ago.  In the ED, HR 140, RR 22, O2 sat 90% on 2 L/min oxygen.  Flu A and B and Covid-19 negative.  CTA chest showed pulmonary emboli involving the left pulmonary artery and the lobar, segmental and subsegmental branches supplying the left lung as well as subsegmental branches supplying the right lower lobe with RV to LV ratio of 0.9 that is suggestive of right heart strain and consistent with at least submassive PE.  Left lower extremity venous Doppler revealed acute DVT seen in the left femoral vein, popliteal vein and posterior tibial vein in the left calf.  Vascular surgery was consulted and plan for catheter-directed thrombolysis today.  Patient started on heparin drip and admitted to hospitalist service for further management.  Vitals improved this AM.   Subjective 1/29: Patient seen this AM, in the ED on hold for a bed.  Sleeping comfortably but awoke to voice.  Says he is feeling better, no longer feels SOB but has not been up moving around.    Assessment & Plan:   Active Problems:   Pulmonary embolism (HCC)   Acute pulmonary embolism (HCC)   Acute Pulmonary Emboli Acute Left Lower Extremity DVT Likely due to being quite sedentary recently, no other provoking factors.  No prior history of blood clots.  --continue heparin gtt --Vascular following, planning for thrombolysis today --will  transition to oral anticoagulant, likely Eliquis prior to discharge --follow up Echo report, pending --will need at least 3 months of anticoagulation  Essential Hypertension - continue home amlodipine and lisinopril  Type 2 Diabetes - uncontrolled.  A1C checked on admission 13.6%, only other in chart is from 2016.  Appears not to be on any meds outpatient for diabetes. --continue moderate sliding scale Novolog --add Lantus 10 units qHS --titrate insulin for glucose 140-180 --will need insulin on discharge and very close follow up     DVT prophylaxis: on heparin gtt   Code Status: Full Code  Family Communication: none at bedside  Disposition Plan:  Expect d/c home once cleared by vascular, and transitioned off heparin drip to oral anticoagulant.   Consultants:   Vascular Surgery  Procedures:   Pulmonary artery thrombolysis planned today  Antimicrobials:   None    Objective: Vitals:   10/11/19 1100 10/11/19 1130 10/11/19 1218 10/11/19 1323  BP: 129/64 126/71 126/76 125/68  Pulse: 72 70 72 (!) 104  Resp: 16 12  18   Temp:   98.2 F (36.8 C) 98.2 F (36.8 C)  TempSrc:   Oral Oral  SpO2: 96% 96% 100% 98%  Weight:      Height:        Intake/Output Summary (Last 24 hours) at 10/11/2019 1349 Last data filed at 10/11/2019 10/13/2019 Gross per 24 hour  Intake 830.6 ml  Output --  Net 830.6 ml   8119  10/10/19 1611 10/11/19 0745  Weight: 136.1 kg 136.1 kg    Examination:  General exam: awake, alert, no acute distress, obese HEENT: clear conjunctiva, anicteric sclera, moist mucus membranes, hearing grossly normal Respiratory system: clear to auscultation bilaterally, no wheezes, rales or rhonchi, normal respiratory effort. Cardiovascular system: normal S1/S2, RRR, no JVD, murmurs, rubs, gallops. Central nervous system: alert and oriented x4. no gross focal neurologic deficits, normal speech Extremities: moves all, normal tone, left lower extremity edema and  calf tender on palpation Skin: dry, intact, normal temperature Psychiatry: normal mood, congruent affect, judgement and insight appear normal    Data Reviewed: I have personally reviewed following labs and imaging studies  CBC: Recent Labs  Lab 10/10/19 1614 10/11/19 0105  WBC 15.4* 12.8*  HGB 15.5 13.9  HCT 47.7 42.2  MCV 84.3 83.6  PLT 247 210   Basic Metabolic Panel: Recent Labs  Lab 10/10/19 1614 10/11/19 0105  NA 129* 132*  K 4.4 4.0  CL 96* 99  CO2 20* 24  GLUCOSE 420* 328*  BUN 14 13  CREATININE 0.86 0.73  CALCIUM 9.4 9.0   GFR: Estimated Creatinine Clearance: 176.4 mL/min (by C-G formula based on SCr of 0.73 mg/dL). Liver Function Tests: No results for input(s): AST, ALT, ALKPHOS, BILITOT, PROT, ALBUMIN in the last 168 hours. No results for input(s): LIPASE, AMYLASE in the last 168 hours. No results for input(s): AMMONIA in the last 168 hours. Coagulation Profile: Recent Labs  Lab 10/10/19 1903  INR 1.0   Cardiac Enzymes: No results for input(s): CKTOTAL, CKMB, CKMBINDEX, TROPONINI in the last 168 hours. BNP (last 3 results) No results for input(s): PROBNP in the last 8760 hours. HbA1C: Recent Labs    10/11/19 0004  HGBA1C 13.6*   CBG: Recent Labs  Lab 10/10/19 2216 10/11/19 0016 10/11/19 0432 10/11/19 0836  GLUCAP 315* 339* 213* 207*   Lipid Profile: No results for input(s): CHOL, HDL, LDLCALC, TRIG, CHOLHDL, LDLDIRECT in the last 72 hours. Thyroid Function Tests: No results for input(s): TSH, T4TOTAL, FREET4, T3FREE, THYROIDAB in the last 72 hours. Anemia Panel: No results for input(s): VITAMINB12, FOLATE, FERRITIN, TIBC, IRON, RETICCTPCT in the last 72 hours. Sepsis Labs: No results for input(s): PROCALCITON, LATICACIDVEN in the last 168 hours.  Recent Results (from the past 240 hour(s))  Respiratory Panel by RT PCR (Flu A&B, Covid) - Nasopharyngeal Swab     Status: None   Collection Time: 10/10/19  7:03 PM   Specimen:  Nasopharyngeal Swab  Result Value Ref Range Status   SARS Coronavirus 2 by RT PCR NEGATIVE NEGATIVE Final    Comment: (NOTE) SARS-CoV-2 target nucleic acids are NOT DETECTED. The SARS-CoV-2 RNA is generally detectable in upper respiratoy specimens during the acute phase of infection. The lowest concentration of SARS-CoV-2 viral copies this assay can detect is 131 copies/mL. A negative result does not preclude SARS-Cov-2 infection and should not be used as the sole basis for treatment or other patient management decisions. A negative result may occur with  improper specimen collection/handling, submission of specimen other than nasopharyngeal swab, presence of viral mutation(s) within the areas targeted by this assay, and inadequate number of viral copies (<131 copies/mL). A negative result must be combined with clinical observations, patient history, and epidemiological information. The expected result is Negative. Fact Sheet for Patients:  https://www.moore.com/ Fact Sheet for Healthcare Providers:  https://www.young.biz/ This test is not yet ap proved or cleared by the [place] FDA and  has been authorized for detection and/or  diagnosis of SARS-CoV-2 by FDA under an Emergency Use Authorization (EUA). This EUA will remain  in effect (meaning this test can be used) for the duration of the COVID-19 declaration under Section 564(b)(1) of the Act, 21 U.S.C. section 360bbb-3(b)(1), unless the authorization is terminated or revoked sooner.    Influenza A by PCR NEGATIVE NEGATIVE Final   Influenza B by PCR NEGATIVE NEGATIVE Final    Comment: (NOTE) The Xpert Xpress SARS-CoV-2/FLU/RSV assay is intended as an aid in  the diagnosis of influenza from Nasopharyngeal swab specimens and  should not be used as a sole basis for treatment. Nasal washings and  aspirates are unacceptable for Xpert Xpress SARS-CoV-2/FLU/RSV  testing. Fact Sheet for  Patients: https://www.moore.com/ Fact Sheet for Healthcare Providers: https://www.young.biz/ This test is not yet approved or cleared by the [place] FDA and  has been authorized for detection and/or diagnosis of SARS-CoV-2 by  FDA under an Emergency Use Authorization (EUA). This EUA will remain  in effect (meaning this test can be used) for the duration of the  Covid-19 declaration under Section 564(b)(1) of the Act, 21  U.S.C. section 360bbb-3(b)(1), unless the authorization is  terminated or revoked. Performed at [facility], [address] [place], [place] [address]          Radiology Studies: CT Angio Chest PE W and/or Wo Contrast  Result Date: 10/10/2019 CLINICAL DATA:  Shortness of breath EXAM: CT ANGIOGRAPHY CHEST WITH CONTRAST TECHNIQUE: Multidetector CT imaging of the chest was performed using the standard protocol during bolus administration of intravenous contrast. Multiplanar CT image reconstructions and MIPs were obtained to evaluate the vascular anatomy. CONTRAST:  OMNIPAQUE IOHEXOL 350 MG/ML SOLN COMPARISON:  Chest radiograph dated 09/09/2016. FINDINGS: Cardiovascular: Satisfactory opacification of the pulmonary arteries to the segmental level. Pulmonary emboli are seen involving the left pulmonary artery and the lobar, segmental, and subsegmental branches supplying the left lung as well as involving subsegmental branches supplying the right lower lobe. The internal diameter of the right ventricle is 4.4 cm compared to 4.8 of the left ventricle in the axial plane (RV to LV ratio equals 0.9), suggestive of right heart strain. The heart is mildly enlarged. No pericardial effusion. Mediastinum/Nodes: No enlarged mediastinal, hilar, or axillary lymph nodes. Thyroid gland, trachea, and esophagus demonstrate no significant findings. Lungs/Pleura: Paraseptal emphysema is noted. There is no focal consolidation or  pneumothorax. Atelectasis/scarring is seen in the posterior right lung and in the left lower lobe. There is pleural thickening with calcification involving the right lung. A loculated pleural effusion versus pleural thickening is seen in the lateral left lung. Upper Abdomen: There is hepatic steatosis. Musculoskeletal: No chest wall abnormality. No acute or significant osseous findings. Review of the MIP images confirms the above findings. IMPRESSION: 1. Pulmonary emboli involving the left pulmonary artery and the lobar, segmental, and subsegmental branches supplying the left lung as well as involving subsegmental branches supplying the right lower lobe. There is an RV to LV ratio of 0.9 which is suggestive of right heart strain. This is consistent with at least submassive (intermediate risk) PE. The presence of right heart strain has been associated with an increased risk of morbidity and mortality. Please activate Code PE by paging [phone]. 2. Calcified pleural thickening with associated atelectasis/scarring involving the right lung which may reflect prior asbestos exposure. 3. Pleural thickening versus a small loculated pleural effusion involving the left lung. 4. Hepatic steatosis. Emphysema (ICD10-J43.9). These results were called by telephone at the time of  interpretation on 10/10/2019 at 7:00 pm to provider Dr. Joan Mayans, who verbally acknowledged these results. Electronically Signed   By: Zerita Boers M.D.   On: 10/10/2019 19:08   US Venous Img Lower Unilateral Left (DVT)  Result Date: 10/10/2019 CLINICAL DATA:  Left leg pain, swelling EXAM: LEFT LOWER EXTREMITY VENOUS DOPPLER ULTRASOUND TECHNIQUE: Gray-scale sonography with graded compression, as well as color Doppler and duplex ultrasound were performed to evaluate the lower extremity deep venous systems from the level of the common femoral vein and including the common femoral, femoral, profunda femoral, popliteal and calf veins including the  posterior tibial, peroneal and gastrocnemius veins when visible. The superficial great saphenous vein was also interrogated. Spectral Doppler was utilized to evaluate flow at rest and with distal augmentation maneuvers in the common femoral, femoral and popliteal veins. COMPARISON:  None. FINDINGS: Contralateral Common Femoral Vein: Respiratory phasicity is normal and symmetric with the symptomatic side. No evidence of thrombus. Normal compressibility. Common Femoral Vein: No evidence of thrombus. Normal compressibility, respiratory phasicity and response to augmentation. Saphenofemoral Junction: No evidence of thrombus. Normal compressibility and flow on color Doppler imaging. Profunda Femoral Vein: No evidence of thrombus. Normal compressibility and flow on color Doppler imaging. Femoral Vein: There is occlusive thrombus noted throughout the left femoral vein. No flow visualized. Vessel is noncompressible. Popliteal Vein: Occlusive thrombus noted throughout the left popliteal vein which is nonocclusive. No flow visualized. Calf Veins: Occlusive thrombus continues into the posterior tibial veins of the left calf. Superficial Great Saphenous Vein: No evidence of thrombus. Normal compressibility. Venous Reflux:  None. Other Findings:  None. IMPRESSION: Acute DVT seen within the left femoral vein, popliteal vein and posterior tibial vein in the left calf. Electronically Signed   By: Rolm Baptise M.D.   On: 10/10/2019 19:38        Scheduled Meds: . [MAR Hold] amLODipine  10 mg Oral Daily  . [START ON 10/12/2019] influenza vac split quadrivalent PF  0.5 mL Intramuscular Tomorrow-1000  . [MAR Hold] insulin aspart  0-15 Units Subcutaneous Q4H  . [MAR Hold] lisinopril  20 mg Oral Daily  . [MAR Hold] multivitamin with minerals  1 tablet Oral Daily   Continuous Infusions: . sodium chloride 75 mL/hr at 10/11/19 0832  . sodium chloride 75 mL/hr at 10/11/19 1255  .  ceFAZolin (ANCEF) IV    . heparin 1,850  Units/hr (10/11/19 1006)     LOS: 1 day    Time spent: 35-40 minutes    Ezekiel Slocumb, DO [facility]   If 7PM-7AM, please contact night-coverage [email or web address] 10/11/2019, 1:49 PM

## 2019-10-11 NOTE — H&P (Signed)
[facility] History & Physical Update  The patient was interviewed and re-examined.  The patient's previous History and Physical has been reviewed and is unchanged.  There is no change in the plan of care. We plan to proceed with the scheduled procedure.  Festus Barren, MD  10/11/2019, 1:24 PM

## 2019-10-11 NOTE — Care Management (Addendum)
TOC RN CM: Unable to assess patient at this time. Will attempt at a later time.

## 2019-10-12 DIAGNOSIS — I82409 Acute embolism and thrombosis of unspecified deep veins of unspecified lower extremity: Secondary | ICD-10-CM | POA: Diagnosis present

## 2019-10-12 LAB — BASIC METABOLIC PANEL
Anion gap: 9 (ref 5–15)
BUN: 11 mg/dL (ref 6–20)
CO2: 25 mmol/L (ref 22–32)
Calcium: 8.5 mg/dL — ABNORMAL LOW (ref 8.9–10.3)
Chloride: 103 mmol/L (ref 98–111)
Creatinine, Ser: 0.52 mg/dL — ABNORMAL LOW (ref 0.61–1.24)
GFR calc Af Amer: >60 mL/min (ref >60)
GFR calc non Af Amer: >60 mL/min (ref >60)
Glucose, Bld: 170 mg/dL — ABNORMAL HIGH (ref 70–99)
Potassium: 4 mmol/L (ref 3.5–5.1)
Sodium: 137 mmol/L (ref 135–145)

## 2019-10-12 LAB — CBC
HCT: 37.8 % — ABNORMAL LOW (ref 39.0–52.0)
Hemoglobin: 12.4 g/dL — ABNORMAL LOW (ref 13.0–17.0)
MCH: 27.5 pg (ref 26.0–34.0)
MCHC: 32.8 g/dL (ref 30.0–36.0)
MCV: 83.8 fL (ref 80.0–100.0)
Platelets: 190 10*3/uL (ref 150–400)
RBC: 4.51 MIL/uL (ref 4.22–5.81)
RDW: 13.5 % (ref 11.5–15.5)
WBC: 10.6 10*3/uL — ABNORMAL HIGH (ref 4.0–10.5)
nRBC: 0 % (ref 0.0–0.2)

## 2019-10-12 LAB — GLUCOSE, CAPILLARY
Glucose-Capillary: 183 mg/dL — ABNORMAL HIGH (ref 70–99)
Glucose-Capillary: 156 mg/dL — ABNORMAL HIGH (ref 70–99)
Glucose-Capillary: 168 mg/dL — ABNORMAL HIGH (ref 70–99)
Glucose-Capillary: 330 mg/dL — ABNORMAL HIGH (ref 70–99)

## 2019-10-12 LAB — HEPARIN LEVEL (UNFRACTIONATED): Heparin Unfractionated: 0.65 IU/mL (ref 0.30–0.70)

## 2019-10-12 MED ORDER — INSULIN GLARGINE 100 UNIT/ML ~~LOC~~ SOLN
15.0000 [IU] | Freq: Every day | SUBCUTANEOUS | Status: DC
  Filled 2019-10-12: qty 0.15

## 2019-10-12 MED ORDER — INSULIN ASPART 100 UNIT/ML ~~LOC~~ SOLN
0.0000 [IU] | SUBCUTANEOUS | Status: DC
  Administered 2019-10-12: 09:00:00 4 [IU] via SUBCUTANEOUS
  Administered 2019-10-12: 15 [IU] via SUBCUTANEOUS
  Administered 2019-10-12: 04:00:00 4 [IU] via SUBCUTANEOUS
  Filled 2019-10-12 (×3): qty 1

## 2019-10-12 MED ORDER — HYDROCODONE-ACETAMINOPHEN 5-325 MG PO TABS: 1.0000 | ORAL_TABLET | Freq: Four times a day (QID) | ORAL | 0 refills | Status: AC | PRN

## 2019-10-12 MED ORDER — INSULIN PEN NEEDLE 30G X 8 MM MISC: 1.0000 | 1 refills | Status: AC | PRN

## 2019-10-12 MED ORDER — APIXABAN 5 MG PO TABS
10.0000 mg | ORAL_TABLET | Freq: Two times a day (BID) | ORAL | Status: DC
  Administered 2019-10-12: 09:00:00 10 mg via ORAL
  Filled 2019-10-12: qty 2

## 2019-10-12 MED ORDER — APIXABAN 5 MG PO TABS: 10.0000 mg | ORAL_TABLET | Freq: Two times a day (BID) | ORAL | 1 refills | Status: AC

## 2019-10-12 MED ORDER — BLOOD GLUCOSE MONITOR KIT: PACK | 0 refills | Status: AC

## 2019-10-12 MED ORDER — NOVOLOG FLEXPEN 100 UNIT/ML ~~LOC~~ SOPN: 0.0000 [IU] | PEN_INJECTOR | Freq: Three times a day (TID) | SUBCUTANEOUS | 0 refills | Status: AC

## 2019-10-12 MED ORDER — HYDROCODONE-ACETAMINOPHEN 5-325 MG PO TABS
1.0000 | ORAL_TABLET | Freq: Four times a day (QID) | ORAL | Status: DC | PRN
  Administered 2019-10-12 (×2): 1 via ORAL
  Filled 2019-10-12 (×2): qty 1

## 2019-10-12 MED ORDER — INSULIN DETEMIR 100 UNIT/ML FLEXPEN: 15.0000 [IU] | Freq: Every day | SUBCUTANEOUS | 0 refills | Status: AC

## 2019-10-12 MED ORDER — INSULIN GLARGINE 100 UNIT/ML SOLOSTAR PEN: 15.0000 [IU] | PEN_INJECTOR | Freq: Every day | SUBCUTANEOUS | 0 refills | Status: AC

## 2019-10-12 MED ORDER — TRAMADOL HCL 50 MG PO TABS: 50.0000 mg | ORAL_TABLET | Freq: Four times a day (QID) | ORAL | Status: DC | PRN

## 2019-10-12 MED ORDER — APIXABAN 5 MG PO TABS: 5.0000 mg | ORAL_TABLET | Freq: Two times a day (BID) | ORAL | Status: DC

## 2019-10-12 NOTE — Progress Notes (Signed)
1 Day Post-Op   Subjective/Chief Complaint: Doing well. Denies chest pain or shortness of breath. Notes Left leg feels better also.   Objective: Vital signs in last 24 hours: Temp:  [98 F (36.7 C)-98.2 F (36.8 C)] 98 F (36.7 C) (01/30 0448) Pulse Rate:  [25-115] 82 (01/30 0448) Resp:  [11-20] 20 (01/30 0448) BP: (100-140)/(59-105) 109/71 (01/30 0448) SpO2:  [88 %-100 %] 97 % (01/30 0448) Weight:  [125.5 kg] 125.5 kg (01/30 0448) Last BM Date: 10/11/19  Intake/Output from previous day: 01/29 0701 - 01/30 0700 In: 612.7 [I.V.:612.7] Out: 1075 [Urine:1075] Intake/Output this shift: Total I/O In: -  Out: 350 [Urine:350]  General appearance: alert and no distress Resp: clear to auscultation bilaterally Cardio: regular rate and rhythm Extremities: edema Left leg, soft, no erythema, nontender  Lab Results:  Recent Labs    10/11/19 0105 10/12/19 0543  WBC 12.8* 10.6*  HGB 13.9 12.4*  HCT 42.2 37.8*  PLT 210 190   BMET Recent Labs    10/11/19 0105 10/12/19 0543  NA 132* 137  K 4.0 4.0  CL 99 103  CO2 24 25  GLUCOSE 328* 170*  BUN 13 11  CREATININE 0.73 0.52*  CALCIUM 9.0 8.5*   PT/INR Recent Labs    10/10/19 1903  LABPROT 12.8  INR 1.0   ABG No results for input(s): PHART, HCO3 in the last 72 hours.  Invalid input(s): PCO2, PO2  Studies/Results: CT Angio Chest PE W and/or Wo Contrast  Result Date: 10/10/2019 CLINICAL DATA:  Shortness of breath EXAM: CT ANGIOGRAPHY CHEST WITH CONTRAST TECHNIQUE: Multidetector CT imaging of the chest was performed using the standard protocol during bolus administration of intravenous contrast. Multiplanar CT image reconstructions and MIPs were obtained to evaluate the vascular anatomy. CONTRAST:  140mL OMNIPAQUE IOHEXOL 350 MG/ML SOLN COMPARISON:  Chest radiograph dated 09/09/2016. FINDINGS: Cardiovascular: Satisfactory opacification of the pulmonary arteries to the segmental level. Pulmonary emboli are seen involving  the left pulmonary artery and the lobar, segmental, and subsegmental branches supplying the left lung as well as involving subsegmental branches supplying the right lower lobe. The internal diameter of the right ventricle is 4.4 cm compared to 4.8 of the left ventricle in the axial plane (RV to LV ratio equals 0.9), suggestive of right heart strain. The heart is mildly enlarged. No pericardial effusion. Mediastinum/Nodes: No enlarged mediastinal, hilar, or axillary lymph nodes. Thyroid gland, trachea, and esophagus demonstrate no significant findings. Lungs/Pleura: Paraseptal emphysema is noted. There is no focal consolidation or pneumothorax. Atelectasis/scarring is seen in the posterior right lung and in the left lower lobe. There is pleural thickening with calcification involving the right lung. A loculated pleural effusion versus pleural thickening is seen in the lateral left lung. Upper Abdomen: There is hepatic steatosis. Musculoskeletal: No chest wall abnormality. No acute or significant osseous findings. Review of the MIP images confirms the above findings. IMPRESSION: 1. Pulmonary emboli involving the left pulmonary artery and the lobar, segmental, and subsegmental branches supplying the left lung as well as involving subsegmental branches supplying the right lower lobe. There is an RV to LV ratio of 0.9 which is suggestive of right heart strain. This is consistent with at least submassive (intermediate risk) PE. The presence of right heart strain has been associated with an increased risk of morbidity and mortality. Please activate Code PE by paging [phone]. 2. Calcified pleural thickening with associated atelectasis/scarring involving the right lung which may reflect prior asbestos exposure. 3. Pleural thickening versus a small loculated  pleural effusion involving the left lung. 4. Hepatic steatosis. Emphysema (ICD10-J43.9). These results were called by telephone at the time of interpretation on  10/10/2019 at 7:00 pm to provider Dr. Colon Branch, who verbally acknowledged these results. Electronically Signed   By: Romona Curls M.D.   On: 10/10/2019 19:08   PERIPHERAL VASCULAR CATHETERIZATION  Result Date: 10/11/2019 See op note  US Venous Img Lower Unilateral Left (DVT)  Result Date: 10/10/2019 CLINICAL DATA:  Left leg pain, swelling EXAM: LEFT LOWER EXTREMITY VENOUS DOPPLER ULTRASOUND TECHNIQUE: Gray-scale sonography with graded compression, as well as color Doppler and duplex ultrasound were performed to evaluate the lower extremity deep venous systems from the level of the common femoral vein and including the common femoral, femoral, profunda femoral, popliteal and calf veins including the posterior tibial, peroneal and gastrocnemius veins when visible. The superficial great saphenous vein was also interrogated. Spectral Doppler was utilized to evaluate flow at rest and with distal augmentation maneuvers in the common femoral, femoral and popliteal veins. COMPARISON:  None. FINDINGS: Contralateral Common Femoral Vein: Respiratory phasicity is normal and symmetric with the symptomatic side. No evidence of thrombus. Normal compressibility. Common Femoral Vein: No evidence of thrombus. Normal compressibility, respiratory phasicity and response to augmentation. Saphenofemoral Junction: No evidence of thrombus. Normal compressibility and flow on color Doppler imaging. Profunda Femoral Vein: No evidence of thrombus. Normal compressibility and flow on color Doppler imaging. Femoral Vein: There is occlusive thrombus noted throughout the left femoral vein. No flow visualized. Vessel is noncompressible. Popliteal Vein: Occlusive thrombus noted throughout the left popliteal vein which is nonocclusive. No flow visualized. Calf Veins: Occlusive thrombus continues into the posterior tibial veins of the left calf. Superficial Great Saphenous Vein: No evidence of thrombus. Normal compressibility. Venous Reflux:  None.  Other Findings:  None. IMPRESSION: Acute DVT seen within the left femoral vein, popliteal vein and posterior tibial vein in the left calf. Electronically Signed   By: Charlett Nose M.D.   On: 10/10/2019 19:38   ECHOCARDIOGRAM COMPLETE  Result Date: 10/11/2019   ECHOCARDIOGRAM REPORT   Patient Name:   Ronald Arias Date of Exam: 10/11/2019 Medical Rec #:  [id number]          Height:       71.0 in Accession #:    [id number]         Weight:       300.0 lb Date of Birth:  [birth year]          BSA:          2.51 m Patient Age:    38 years           BP:           116/70 mmHg Patient Gender: M                  HR:           73 bpm. Exam Location:  [facility] Procedure: 2D Echo, Cardiac Doppler, Color Doppler and Intracardiac            Opacification Agent Indications:     I26.99 Pulmonary Embolus  History:         Patient has prior history of Echocardiogram examinations. Risk                  Factors:Hypertension.  Sonographer:     Humphrey Rolls RDCS (AE) Referring Phys:  [id number] Vernetta Honey MANSY Diagnosing Phys: Adrian Blackwater MD  Sonographer Comments: Technically difficult study due to  poor echo windows. Image acquisition challenging due to patient body habitus. IMPRESSIONS  1. Left ventricular ejection fraction, by visual estimation, is 60 to 65%. The left ventricle has normal function. There is no left ventricular hypertrophy.  2. Definity contrast agent was given IV to delineate the left ventricular endocardial borders.  3. The left ventricle has no regional wall motion abnormalities.  4. Global right ventricle has normal systolic function.The right ventricular size is normal. No increase in right ventricular wall thickness.  5. Left atrial size was normal.  6. Right atrial size was normal.  7. The mitral valve is normal in structure. No evidence of mitral valve regurgitation. No evidence of mitral stenosis.  8. The tricuspid valve is normal in structure.  9. The tricuspid valve is normal in structure. Tricuspid valve  regurgitation is not demonstrated. 10. The aortic valve is normal in structure. Aortic valve regurgitation is not visualized. No evidence of aortic valve sclerosis or stenosis. 11. The pulmonic valve was normal in structure. Pulmonic valve regurgitation is not visualized. 12. The inferior vena cava is normal in size with greater than 50% respiratory variability, suggesting right atrial pressure of 3 mmHg. FINDINGS  Left Ventricle: Left ventricular ejection fraction, by visual estimation, is 60 to 65%. The left ventricle has normal function. Definity contrast agent was given IV to delineate the left ventricular endocardial borders. The left ventricle has no regional wall motion abnormalities. There is no left ventricular hypertrophy. Normal left atrial pressure. Right Ventricle: The right ventricular size is normal. No increase in right ventricular wall thickness. Global RV systolic function is has normal systolic function. Left Atrium: Left atrial size was normal in size. Right Atrium: Right atrial size was normal in size Pericardium: There is no evidence of pericardial effusion. Mitral Valve: The mitral valve is normal in structure. No evidence of mitral valve regurgitation. No evidence of mitral valve stenosis by observation. MV peak gradient, 5.6 mmHg. Tricuspid Valve: The tricuspid valve is normal in structure. Tricuspid valve regurgitation is not demonstrated. Aortic Valve: The aortic valve is normal in structure. Aortic valve regurgitation is not visualized. The aortic valve is structurally normal, with no evidence of sclerosis or stenosis. Aortic valve mean gradient measures 5.0 mmHg. Aortic valve peak gradient measures 10.4 mmHg. Aortic valve area, by VTI measures 3.12 cm. Pulmonic Valve: The pulmonic valve was normal in structure. Pulmonic valve regurgitation is not visualized. Pulmonic regurgitation is not visualized. Aorta: The aortic root, ascending aorta and aortic arch are all structurally normal,  with no evidence of dilitation or obstruction. Venous: The inferior vena cava is normal in size with greater than 50% respiratory variability, suggesting right atrial pressure of 3 mmHg. IAS/Shunts: No atrial level shunt detected by color flow Doppler. There is no evidence of a patent foramen ovale. No ventricular septal defect is seen or detected. There is no evidence of an atrial septal defect.  LEFT VENTRICLE PLAX 2D LVIDd:         5.49 cm  Diastology LVIDs:         4.28 cm  LV e' lateral:   15.20 cm/s LV PW:         0.81 cm  LV E/e' lateral: 6.7 LV IVS:        0.62 cm  LV e' medial:    11.20 cm/s LVOT diam:     2.20 cm  LV E/e' medial:  9.1 LV SV:         65 ml LV SV Index:  24.20 LVOT Area:     3.80 cm  RIGHT VENTRICLE RV Basal diam:  4.01 cm LEFT ATRIUM           Index       RIGHT ATRIUM           Index LA diam:      3.80 cm 1.52 cm/m  RA Area:     14.80 cm LA Vol (A4C): 30.3 ml 12.10 ml/m RA Volume:   37.30 ml  14.89 ml/m  AORTIC VALVE                    PULMONIC VALVE AV Area (Vmax):    2.79 cm     PV Vmax:       1.05 m/s AV Area (Vmean):   2.84 cm     PV Vmean:      68.500 cm/s AV Area (VTI):     3.12 cm     PV VTI:        0.209 m AV Vmax:           161.00 cm/s  PV Peak grad:  4.4 mmHg AV Vmean:          110.000 cm/s PV Mean grad:  2.0 mmHg AV VTI:            0.274 m AV Peak Grad:      10.4 mmHg AV Mean Grad:      5.0 mmHg LVOT Vmax:         118.00 cm/s LVOT Vmean:        82.200 cm/s LVOT VTI:          0.225 m LVOT/AV VTI ratio: 0.82  AORTA Ao Root diam: 3.10 cm MITRAL VALVE MV Area (PHT): 3.53 cm              SHUNTS MV Peak grad:  5.6 mmHg              Systemic VTI:  0.22 m MV Mean grad:  3.0 mmHg              Systemic Diam: 2.20 cm MV Vmax:       1.18 m/s MV Vmean:      81.6 cm/s MV VTI:        0.27 m MV PHT:        62.35 msec MV Decel Time: 215 msec MV E velocity: 102.00 cm/s 103 cm/s MV A velocity: 92.60 cm/s  70.3 cm/s MV E/A ratio:  1.10        1.5  Adrian Blackwater MD Electronically signed by  Adrian Blackwater MD Signature Date/Time: 10/11/2019/4:15:20 PM    Final     Anti-infectives: Anti-infectives (From admission, onward)   Start     Dose/Rate Route Frequency Ordered Stop   10/11/19 1400  ceFAZolin (ANCEF) IVPB 2g/100 mL premix    Note to Pharmacy: To be given in specials   2 g 200 mL/hr over 30 Minutes Intravenous  Once 10/11/19 1208 10/11/19 1457      Assessment/Plan: s/p Procedure(s): PULMONARY THROMBECTOMY / LYSIS with Possible Left Lower Extremity Venous Thrombectomy / Lysis possible stent placement (N/A) POD #1  May remove PAD this afternoon. OK from Vascular standpoint to D/C home with apixaban. Follow up out patient in Vascular Surgery office in 2-3 weeks.  LOS: 2 days    Bertram Denver 10/12/2019

## 2019-10-12 NOTE — Consult Note (Signed)
ANTICOAGULATION CONSULT NOTE  Pharmacy Consult for apixaban Indication: pulmonary embolus  No Known Allergies  Patient Measurements: Height: 5\' 11"  (180.3 cm) Weight: 276 lb 9.6 oz (125.5 kg) IBW/kg (Calculated) : 75.3 Heparin Dosing Weight: 106.7 kg  Vital Signs: Temp: 98 F (36.7 C) (01/30 0448) Temp Source: Oral (01/30 0448) BP: 109/71 (01/30 0448) Pulse Rate: 82 (01/30 0448)  Labs: Recent Labs    10/10/19 1614 10/10/19 1614 10/10/19 1638 10/10/19 1815 10/10/19 1903 10/11/19 0105 10/11/19 0105 10/11/19 0833 10/11/19 2105 10/12/19 0543  HGB 15.5   < >  --   --   --  13.9  --   --   --  12.4*  HCT 47.7  --   --   --   --  42.2  --   --   --  37.8*  PLT 247  --   --   --   --  210  --   --   --  190  APTT  --   --   --   --  28  --   --   --   --   --   LABPROT  --   --   --   --  12.8  --   --   --   --   --   INR  --   --   --   --  1.0  --   --   --   --   --   HEPARINUNFRC  --   --   --   --   --  0.43   < > 0.24* 0.28* 0.65  CREATININE 0.86  --   --   --   --  0.73  --   --   --  0.52*  TROPONINIHS  --   --  6 4  --   --   --   --   --   --    < > = values in this interval not displayed.    Estimated Creatinine Clearance: 168.9 mL/min (A) (by C-G formula based on SCr of 0.52 mg/dL (L)).   Medical History: Past Medical History:  Diagnosis Date  . Hypertension    Medications:  No PTA anticoagulation of record  Assessment: 39 year old male with a past medical history of hypertension, obesity, active tobacco abuse, diabetes type 2, history of opioid abuse, vitamin D deficiency who presented to the [facility] emergency department with large left lobe pulmonary embolus with right heart strain and also with extensive left lower extremity DVT. He has been on IV heparin since 10/10/19. H&H, PLT trending down  Goal of Therapy:  Monitor platelets by anticoagulation protocol: Yes   Plan:   Stop heparin then one hour later start apixaban  10 mg BID for 7 days, then 5 mg BID  CBC at least every 3 days  10/12/19, PharmD, BCPS Clinical Pharmacist 10/12/2019 8:12 AM

## 2019-10-12 NOTE — Plan of Care (Signed)

## 2019-10-12 NOTE — Discharge Summary (Addendum)
Physician Discharge Summary  Ronald Arias ZMO:[id number] DOB: 03/13/1981 DOA: 10/10/2019  PCP: Patient, No Pcp Per  Admit date: 10/10/2019 Discharge date: 10/12/2019  Admitted From: Home Disposition:  Home  Recommendations for Outpatient Follow-up:  1. Follow up with PCP in 1-2 weeks 2. Please obtain BMP/CBC in one week 3. Please follow up with vascular surgery in 2-3 weeks 4. Please follow up on blood glucose readings at home, as patient discharged on insulin for new diagnosis of diabetes with Hbg A1C of 13.6%.  Insulin adjustments likely needed. 5. Vascular surgeon recommends a lower extremity Venogram in near future to assess for possible May Thurner syndrome as cause of his extensive DVT  Home Health: No  Equipment/Devices: None   Discharge Condition: Stable  CODE STATUS: Full  Diet recommendation: Carb Modified  Brief/Interim Summary:  ChristopherMussonis a39 y.o.Caucasian malewith a known history of hypertension and type 2 diabetes mellitus, presented to the ED on 1/28 with left leg pain and swelling that started that morning with associated dyspnea mainly on exertion with no cough or wheezing or chest pain or hemoptysis or palpitations. Admitted to being nonambulatory over the last 3 days as he states he did not feel right.  No recent surgeries, long travel, quit smoking 3 years ago.  In the ED, HR 140, RR 22, O2 sat 90% on 2 L/min oxygen.  Flu A and B and Covid-19 negative.  CTAchest showed pulmonary emboli involving the left pulmonary artery and the lobar, segmental and subsegmental branches supplying the left lung as well as subsegmental branches supplying the right lower lobe with RV to LV ratio of 0.9 that is suggestive of right heart strain and consistent with at least submassive PE.  Left lower extremity venous Doppler revealed acute DVT seen in the left femoral vein, popliteal vein and posterior tibial vein in the left calf. Vascular surgery was consulted and plan  for catheter-directed thrombolysis today.  Patient started on heparin drip and admitted to hospitalist service for further management.  Vitals improved by following morning.  Patient underwent thrombolysis and thrombectomy with vascular surgery on 1/29, tolerated well without complications.  Echo showed EF 60-65% without signs of right heart strain.  Newly diagnosed diabetes with A1C of 13.6%. Started on insulin during admission, tolerated well.  Will be discharged on 15 units long-acting and resistant sliding scale for now.  Patient agrees to very close PCP follow up and will bring log of his blood sugars.   ADDENDUM: sent Lantus pens, pt found out not covered by ins, so sent Levemir instead.  I informed his pharmacy of the purpose of the duplicate order.   Discharge Diagnoses: Principal Problem:   Acute pulmonary embolism ([place]) Active Problems:   DVT of lower extremity (deep venous thrombosis) (HCC)   Type 2 diabetes mellitus (HCC)   HTN (hypertension)    Discharge Instructions   Discharge Instructions    Call MD for:   Complete by: As directed    Racing heart, shortness of breath or rapid breathing.   Call MD for:  severe uncontrolled pain   Complete by: As directed    Call MD for:  temperature >100.4   Complete by: As directed    Diet - low sodium heart healthy   Complete by: As directed    Discharge instructions   Complete by: As directed    Keep log of your blood sugars to bring to primary care follow-up.   If you are seeing low blood sugar (less than 70-80) or  having hypoglycemic symptoms (shaky, weak, sweaty etc), need to reduce the amount of insulin with meals.  Sliding Scale Insulin dosing based on your blood sugar: - if glucose 70-120: 0 units - if 121-150: 3 units - if 151-200: 4 units - if 201-250: 7 units - if 251-300: 11 units - if 301-350: 15 units - if glucose 351-400: 20 units - if over 400, take 25 units and call your doctor  Eliquis (aka apixaban) -  blood thinner for your DVT/PE's.  You will take 10 mg twice daily for 1 week, then 5 mg twice daily after that (so reduce to 5 mg on 10/19/19).   Increase activity slowly   Complete by: As directed      Allergies as of 10/12/2019   No Known Allergies     Medication List    TAKE these medications   amLODipine 10 MG tablet Commonly known as: NORVASC Take 1 tablet (10 mg total) by mouth daily.   apixaban 5 MG Tabs tablet Commonly known as: ELIQUIS Take 2 tablets (10 mg total) by mouth 2 (two) times daily. On 10/19/19 reduce to 5 mg by mouth 2 times daily.   blood glucose meter kit and supplies Kit Dispense based on patient and insurance preference. Use up to four times daily as directed. (FOR ICD-9 250.00, 250.01).   HYDROcodone-acetaminophen 5-325 MG tablet Commonly known as: NORCO/VICODIN Take 1 tablet by mouth every 6 (six) hours as needed for up to 5 days for severe pain.   insulin detemir 100 unit/ml Soln Commonly known as: LEVEMIR Inject 0.15 mLs (15 Units total) into the skin at bedtime.   Insulin Glargine 100 UNIT/ML Solostar Pen Commonly known as: LANTUS Inject 15 Units into the skin at bedtime.   Insulin Pen Needle 30G X 8 MM Misc Commonly known as: NOVOFINE Inject 10 each into the skin as needed.   lisinopril 20 MG tablet Commonly known as: ZESTRIL Take 1 tablet (20 mg total) by mouth daily. What changed: when to take this   multivitamin with minerals Tabs tablet Take 1 tablet by mouth daily.   NovoLOG FlexPen 100 UNIT/ML FlexPen Generic drug: insulin aspart Inject 0-20 Units into the skin 3 (three) times daily with meals. Per Resistant Sliding Scale      Follow-up Information    Dew, Erskine Squibb, MD Follow up in 1 week(s).   Specialties: Vascular Surgery, Radiology, Interventional Cardiology Why: Can see Dew or Arna Medici. Will need left lower extremity DVT study with visit.  Contact information: [place] [address] [phone]         Care, [facility]. Schedule an appointment as soon as possible for a visit in 3 day(s).   Specialty: Family Medicine Contact information: [address] [place] [address] [phone]          No Known Allergies  Consultations:  Vascular surgery    Procedures/Studies: CT Angio Chest PE W and/or Wo Contrast  Result Date: 10/10/2019 CLINICAL DATA:  Shortness of breath EXAM: CT ANGIOGRAPHY CHEST WITH CONTRAST TECHNIQUE: Multidetector CT imaging of the chest was performed using the standard protocol during bolus administration of intravenous contrast. Multiplanar CT image reconstructions and MIPs were obtained to evaluate the vascular anatomy. CONTRAST:  142m OMNIPAQUE IOHEXOL 350 MG/ML SOLN COMPARISON:  Chest radiograph dated 09/09/2016. FINDINGS: Cardiovascular: Satisfactory opacification of the pulmonary arteries to the segmental level. Pulmonary emboli are seen involving the left pulmonary artery and the lobar, segmental, and subsegmental branches supplying the left lung  as well as involving subsegmental branches supplying the right lower lobe. The internal diameter of the right ventricle is 4.4 cm compared to 4.8 of the left ventricle in the axial plane (RV to LV ratio equals 0.9), suggestive of right heart strain. The heart is mildly enlarged. No pericardial effusion. Mediastinum/Nodes: No enlarged mediastinal, hilar, or axillary lymph nodes. Thyroid gland, trachea, and esophagus demonstrate no significant findings. Lungs/Pleura: Paraseptal emphysema is noted. There is no focal consolidation or pneumothorax. Atelectasis/scarring is seen in the posterior right lung and in the left lower lobe. There is pleural thickening with calcification involving the right lung. A loculated pleural effusion versus pleural thickening is seen in the lateral left lung. Upper Abdomen: There is hepatic steatosis. Musculoskeletal: No chest wall abnormality. No acute or significant osseous findings. Review of  the MIP images confirms the above findings. IMPRESSION: 1. Pulmonary emboli involving the left pulmonary artery and the lobar, segmental, and subsegmental branches supplying the left lung as well as involving subsegmental branches supplying the right lower lobe. There is an RV to LV ratio of 0.9 which is suggestive of right heart strain. This is consistent with at least submassive (intermediate risk) PE. The presence of right heart strain has been associated with an increased risk of morbidity and mortality. Please activate Code PE by paging [phone]. 2. Calcified pleural thickening with associated atelectasis/scarring involving the right lung which may reflect prior asbestos exposure. 3. Pleural thickening versus a small loculated pleural effusion involving the left lung. 4. Hepatic steatosis. Emphysema (ICD10-J43.9). These results were called by telephone at the time of interpretation on 10/10/2019 at 7:00 pm to provider Dr. Joan Mayans, who verbally acknowledged these results. Electronically Signed   By: Zerita Boers M.D.   On: 10/10/2019 19:08   PERIPHERAL VASCULAR CATHETERIZATION  Result Date: 10/11/2019 See op note  US Venous Img Lower Unilateral Left (DVT)  Result Date: 10/10/2019 CLINICAL DATA:  Left leg pain, swelling EXAM: LEFT LOWER EXTREMITY VENOUS DOPPLER ULTRASOUND TECHNIQUE: Gray-scale sonography with graded compression, as well as color Doppler and duplex ultrasound were performed to evaluate the lower extremity deep venous systems from the level of the common femoral vein and including the common femoral, femoral, profunda femoral, popliteal and calf veins including the posterior tibial, peroneal and gastrocnemius veins when visible. The superficial great saphenous vein was also interrogated. Spectral Doppler was utilized to evaluate flow at rest and with distal augmentation maneuvers in the common femoral, femoral and popliteal veins. COMPARISON:  None. FINDINGS: Contralateral Common Femoral  Vein: Respiratory phasicity is normal and symmetric with the symptomatic side. No evidence of thrombus. Normal compressibility. Common Femoral Vein: No evidence of thrombus. Normal compressibility, respiratory phasicity and response to augmentation. Saphenofemoral Junction: No evidence of thrombus. Normal compressibility and flow on color Doppler imaging. Profunda Femoral Vein: No evidence of thrombus. Normal compressibility and flow on color Doppler imaging. Femoral Vein: There is occlusive thrombus noted throughout the left femoral vein. No flow visualized. Vessel is noncompressible. Popliteal Vein: Occlusive thrombus noted throughout the left popliteal vein which is nonocclusive. No flow visualized. Calf Veins: Occlusive thrombus continues into the posterior tibial veins of the left calf. Superficial Great Saphenous Vein: No evidence of thrombus. Normal compressibility. Venous Reflux:  None. Other Findings:  None. IMPRESSION: Acute DVT seen within the left femoral vein, popliteal vein and posterior tibial vein in the left calf. Electronically Signed   By: Rolm Baptise M.D.   On: 10/10/2019 19:38   ECHOCARDIOGRAM COMPLETE  Result Date: 10/11/2019  ECHOCARDIOGRAM REPORT   Patient Name:   Ronald Arias Date of Exam: 10/11/2019 Medical Rec #:  [id number]          Height:       71.0 in Accession #:    [id number]         Weight:       300.0 lb Date of Birth:  05-26-1981          BSA:          2.51 m Patient Age:    39 years           BP:           116/70 mmHg Patient Gender: M                  HR:           73 bpm. Exam Location:  [facility] Procedure: 2D Echo, Cardiac Doppler, Color Doppler and Intracardiac            Opacification Agent Indications:     I26.99 Pulmonary Embolus  History:         Patient has prior history of Echocardiogram examinations. Risk                  Factors:Hypertension.  Sonographer:     Charmayne Sheer RDCS (AE) Referring Phys:  [id number] [place] Diagnosing Phys: Neoma Laming MD  Sonographer  Comments: Technically difficult study due to poor echo windows. Image acquisition challenging due to patient body habitus. IMPRESSIONS  1. Left ventricular ejection fraction, by visual estimation, is 60 to 65%. The left ventricle has normal function. There is no left ventricular hypertrophy.  2. Definity contrast agent was given IV to delineate the left ventricular endocardial borders.  3. The left ventricle has no regional wall motion abnormalities.  4. Global right ventricle has normal systolic function.The right ventricular size is normal. No increase in right ventricular wall thickness.  5. Left atrial size was normal.  6. Right atrial size was normal.  7. The mitral valve is normal in structure. No evidence of mitral valve regurgitation. No evidence of mitral stenosis.  8. The tricuspid valve is normal in structure.  9. The tricuspid valve is normal in structure. Tricuspid valve regurgitation is not demonstrated. 10. The aortic valve is normal in structure. Aortic valve regurgitation is not visualized. No evidence of aortic valve sclerosis or stenosis. 11. The pulmonic valve was normal in structure. Pulmonic valve regurgitation is not visualized. 12. The inferior vena cava is normal in size with greater than 50% respiratory variability, suggesting right atrial pressure of 3 mmHg. FINDINGS  Left Ventricle: Left ventricular ejection fraction, by visual estimation, is 60 to 65%. The left ventricle has normal function. Definity contrast agent was given IV to delineate the left ventricular endocardial borders. The left ventricle has no regional wall motion abnormalities. There is no left ventricular hypertrophy. Normal left atrial pressure. Right Ventricle: The right ventricular size is normal. No increase in right ventricular wall thickness. Global RV systolic function is has normal systolic function. Left Atrium: Left atrial size was normal in size. Right Atrium: Right atrial size was normal in size Pericardium:  There is no evidence of pericardial effusion. Mitral Valve: The mitral valve is normal in structure. No evidence of mitral valve regurgitation. No evidence of mitral valve stenosis by observation. MV peak gradient, 5.6 mmHg. Tricuspid Valve: The tricuspid valve is normal in structure. Tricuspid valve regurgitation is not demonstrated. Aortic Valve: The  aortic valve is normal in structure. Aortic valve regurgitation is not visualized. The aortic valve is structurally normal, with no evidence of sclerosis or stenosis. Aortic valve mean gradient measures 5.0 mmHg. Aortic valve peak gradient measures 10.4 mmHg. Aortic valve area, by VTI measures 3.12 cm. Pulmonic Valve: The pulmonic valve was normal in structure. Pulmonic valve regurgitation is not visualized. Pulmonic regurgitation is not visualized. Aorta: The aortic root, ascending aorta and aortic arch are all structurally normal, with no evidence of dilitation or obstruction. Venous: The inferior vena cava is normal in size with greater than 50% respiratory variability, suggesting right atrial pressure of 3 mmHg. IAS/Shunts: No atrial level shunt detected by color flow Doppler. There is no evidence of a patent foramen ovale. No ventricular septal defect is seen or detected. There is no evidence of an atrial septal defect.  LEFT VENTRICLE PLAX 2D LVIDd:         5.49 cm  Diastology LVIDs:         4.28 cm  LV e' lateral:   15.20 cm/s LV PW:         0.81 cm  LV E/e' lateral: 6.7 LV IVS:        0.62 cm  LV e' medial:    11.20 cm/s LVOT diam:     2.20 cm  LV E/e' medial:  9.1 LV SV:         65 ml LV SV Index:   24.20 LVOT Area:     3.80 cm  RIGHT VENTRICLE RV Basal diam:  4.01 cm LEFT ATRIUM           Index       RIGHT ATRIUM           Index LA diam:      3.80 cm 1.52 cm/m  RA Area:     14.80 cm LA Vol (A4C): 30.3 ml 12.10 ml/m RA Volume:   37.30 ml  14.89 ml/m  AORTIC VALVE                    PULMONIC VALVE AV Area (Vmax):    2.79 cm     PV Vmax:       1.05 m/s  AV Area (Vmean):   2.84 cm     PV Vmean:      68.500 cm/s AV Area (VTI):     3.12 cm     PV VTI:        0.209 m AV Vmax:           161.00 cm/s  PV Peak grad:  4.4 mmHg AV Vmean:          110.000 cm/s PV Mean grad:  2.0 mmHg AV VTI:            0.274 m AV Peak Grad:      10.4 mmHg AV Mean Grad:      5.0 mmHg LVOT Vmax:         118.00 cm/s LVOT Vmean:        82.200 cm/s LVOT VTI:          0.225 m LVOT/AV VTI ratio: 0.82  AORTA Ao Root diam: 3.10 cm MITRAL VALVE MV Area (PHT): 3.53 cm              SHUNTS MV Peak grad:  5.6 mmHg              Systemic VTI:  0.22 m MV Mean grad:  3.0 mmHg  Systemic Diam: 2.20 cm MV Vmax:       1.18 m/s MV Vmean:      81.6 cm/s MV VTI:        0.27 m MV PHT:        62.35 msec MV Decel Time: 215 msec MV E velocity: 102.00 cm/s 103 cm/s MV A velocity: 92.60 cm/s  70.3 cm/s MV E/A ratio:  1.10        1.5  Neoma Laming MD Electronically signed by Neoma Laming MD Signature Date/Time: 10/11/2019/4:15:20 PM    Final     10/11/19 Vascular Procedure(s) Performed:             1.  Contrast injection right heart             2.  Thrombolysis lateral pulmonary arteries             3.  Mechanical thrombectomy with the penumbra cat 8 device to the left lower lobe, right middle lobe, and right lower lobe pulmonary arteries             4.  Selective catheter placement right middle and lower lobe pulmonary arteries             5.  Selective catheter placement left upper and lower lobe pulmonary arteries             6.  Left iliofemoral venogram   Subjective: Patient reports feeling well this AM.  No SOB or DOE or CP.  Had significant left leg pain earlier in the AM, relieved with Norco but not Tylenol.  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     Discharge Exam: Vitals:   10/12/19 0326 10/12/19 0448  BP:  109/71  Pulse: 69 82  Resp:  20  Temp:  98 F (36.7 C)  SpO2:  97%   Vitals:   10/11/19 1650  10/11/19 1944 10/12/19 0326 10/12/19 0448  BP: 103/63 121/69  109/71  Pulse: 82 100 69 82  Resp: 16 20  20   Temp:  98.2 F (36.8 C)  98 F (36.7 C)  TempSrc:  Oral  Oral  SpO2: 100% 100%  97%  Weight:    125.5 kg  Height:        General: Pt is alert, awake, not in acute distress Cardiovascular: RRR, S1/S2 +, no rubs, no gallops Respiratory: CTA bilaterally, no wheezing, no rhonchi Abdominal: Soft, NT, ND, bowel sounds + Extremities: LLE edema somewhat improve, still with calf tenderness, no cyanosis    The results of significant diagnostics from this hospitalization (including imaging, microbiology, ancillary and laboratory) are listed below for reference.     Microbiology: Recent Results (from the past 240 hour(s))  Respiratory Panel by RT PCR (Flu A&B, Covid) - Nasopharyngeal Swab     Status: None   Collection Time: 10/10/19  7:03 PM   Specimen: Nasopharyngeal Swab  Result Value Ref Range Status   SARS Coronavirus 2 by RT PCR NEGATIVE NEGATIVE Final    Comment: (NOTE) SARS-CoV-2 target nucleic acids are NOT DETECTED. The SARS-CoV-2 RNA is generally detectable in upper respiratoy specimens during the acute phase of infection. The lowest concentration of SARS-CoV-2 viral copies this assay can detect is 131 copies/mL. A negative result does not preclude SARS-Cov-2 infection and should not be used as the sole basis for treatment or other patient management decisions. A negative result may occur with  improper specimen collection/handling, submission of  specimen other than nasopharyngeal swab, presence of viral mutation(s) within the areas targeted by this assay, and inadequate number of viral copies (<131 copies/mL). A negative result must be combined with clinical observations, patient history, and epidemiological information. The expected result is Negative. Fact Sheet for Patients:  PinkCheek.be Fact Sheet for Healthcare Providers:   GravelBags.it This test is not yet ap proved or cleared by the [place] FDA and  has been authorized for detection and/or diagnosis of SARS-CoV-2 by FDA under an Emergency Use Authorization (EUA). This EUA will remain  in effect (meaning this test can be used) for the duration of the COVID-19 declaration under Section 564(b)(1) of the Act, 21 U.S.C. section 360bbb-3(b)(1), unless the authorization is terminated or revoked sooner.    Influenza A by PCR NEGATIVE NEGATIVE Final   Influenza B by PCR NEGATIVE NEGATIVE Final    Comment: (NOTE) The Xpert Xpress SARS-CoV-2/FLU/RSV assay is intended as an aid in  the diagnosis of influenza from Nasopharyngeal swab specimens and  should not be used as a sole basis for treatment. Nasal washings and  aspirates are unacceptable for Xpert Xpress SARS-CoV-2/FLU/RSV  testing. Fact Sheet for Patients: PinkCheek.be Fact Sheet for Healthcare Providers: GravelBags.it This test is not yet approved or cleared by the [place] FDA and  has been authorized for detection and/or diagnosis of SARS-CoV-2 by  FDA under an Emergency Use Authorization (EUA). This EUA will remain  in effect (meaning this test can be used) for the duration of the  Covid-19 declaration under Section 564(b)(1) of the Act, 21  U.S.C. section 360bbb-3(b)(1), unless the authorization is  terminated or revoked. Performed at [facility], [address] [place], [place] [address]      Labs: BNP (last 3 results) Recent Labs    10/10/19 1614  BNP 00.3   Basic Metabolic Panel: Recent Labs  Lab 10/10/19 1614 10/11/19 0105 10/12/19 0543  NA 129* 132* 137  K 4.4 4.0 4.0  CL 96* 99 103  CO2 20* 24 25  GLUCOSE 420* 328* 170*  BUN 14 13 11   CREATININE 0.86 0.73 0.52*  CALCIUM 9.4 9.0 8.5*   Liver Function Tests: No results for input(s): AST, ALT, ALKPHOS, BILITOT, PROT,  ALBUMIN in the last 168 hours. No results for input(s): LIPASE, AMYLASE in the last 168 hours. No results for input(s): AMMONIA in the last 168 hours. CBC: Recent Labs  Lab 10/10/19 1614 10/11/19 0105 10/12/19 0543  WBC 15.4* 12.8* 10.6*  HGB 15.5 13.9 12.4*  HCT 47.7 42.2 37.8*  MCV 84.3 83.6 83.8  PLT 247 210 190   Cardiac Enzymes: No results for input(s): CKTOTAL, CKMB, CKMBINDEX, TROPONINI in the last 168 hours. BNP: Invalid input(s): POCBNP CBG: Recent Labs  Lab 10/11/19 2351 10/12/19 0416 10/12/19 0447 10/12/19 0743 10/12/19 1146  GLUCAP 408* 183* 156* 168* 330*   D-Dimer No results for input(s): DDIMER in the last 72 hours. Hgb A1c Recent Labs    10/11/19 0004  HGBA1C 13.6*   Lipid Profile No results for input(s): CHOL, HDL, LDLCALC, TRIG, CHOLHDL, LDLDIRECT in the last 72 hours. Thyroid function studies No results for input(s): TSH, T4TOTAL, T3FREE, THYROIDAB in the last 72 hours.  Invalid input(s): FREET3 Anemia work up No results for input(s): VITAMINB12, FOLATE, FERRITIN, TIBC, IRON, RETICCTPCT in the last 72 hours. Urinalysis No results found for: COLORURINE, APPEARANCEUR, [place], [place], GLUCOSEU, [place], BILIRUBINUR, KETONESUR, PROTEINUR, UROBILINOGEN, NITRITE, LEUKOCYTESUR Sepsis Labs Invalid input(s): PROCALCITONIN,  WBC,  LACTICIDVEN Microbiology Recent Results (  from the past 240 hour(s))  Respiratory Panel by RT PCR (Flu A&B, Covid) - Nasopharyngeal Swab     Status: None   Collection Time: 10/10/19  7:03 PM   Specimen: Nasopharyngeal Swab  Result Value Ref Range Status   SARS Coronavirus 2 by RT PCR NEGATIVE NEGATIVE Final    Comment: (NOTE) SARS-CoV-2 target nucleic acids are NOT DETECTED. The SARS-CoV-2 RNA is generally detectable in upper respiratoy specimens during the acute phase of infection. The lowest concentration of SARS-CoV-2 viral copies this assay can detect is 131 copies/mL. A negative result does not preclude  SARS-Cov-2 infection and should not be used as the sole basis for treatment or other patient management decisions. A negative result may occur with  improper specimen collection/handling, submission of specimen other than nasopharyngeal swab, presence of viral mutation(s) within the areas targeted by this assay, and inadequate number of viral copies (<131 copies/mL). A negative result must be combined with clinical observations, patient history, and epidemiological information. The expected result is Negative. Fact Sheet for Patients:  PinkCheek.be Fact Sheet for Healthcare Providers:  GravelBags.it This test is not yet ap proved or cleared by the [place] FDA and  has been authorized for detection and/or diagnosis of SARS-CoV-2 by FDA under an Emergency Use Authorization (EUA). This EUA will remain  in effect (meaning this test can be used) for the duration of the COVID-19 declaration under Section 564(b)(1) of the Act, 21 U.S.C. section 360bbb-3(b)(1), unless the authorization is terminated or revoked sooner.    Influenza A by PCR NEGATIVE NEGATIVE Final   Influenza B by PCR NEGATIVE NEGATIVE Final    Comment: (NOTE) The Xpert Xpress SARS-CoV-2/FLU/RSV assay is intended as an aid in  the diagnosis of influenza from Nasopharyngeal swab specimens and  should not be used as a sole basis for treatment. Nasal washings and  aspirates are unacceptable for Xpert Xpress SARS-CoV-2/FLU/RSV  testing. Fact Sheet for Patients: PinkCheek.be Fact Sheet for Healthcare Providers: GravelBags.it This test is not yet approved or cleared by the [place] FDA and  has been authorized for detection and/or diagnosis of SARS-CoV-2 by  FDA under an Emergency Use Authorization (EUA). This EUA will remain  in effect (meaning this test can be used) for the duration of the  Covid-19  declaration under Section 564(b)(1) of the Act, 21  U.S.C. section 360bbb-3(b)(1), unless the authorization is  terminated or revoked. Performed at [facility], [address] [place],  [address]      Time coordinating discharge: Over 30 minutes  SIGNED:   Ezekiel Slocumb, DO [facility] 10/12/2019, 4:50 PM   If 7PM-7AM, please contact night-coverage [email or web address]

## 2019-10-12 NOTE — Consult Note (Addendum)
ANTICOAGULATION CONSULT NOTE - Initial Consult  Pharmacy Consult for Heparin Indication: pulmonary embolus  No Known Allergies  Patient Measurements: Height: 5\' 11"  (180.3 cm) Weight: 276 lb 9.6 oz (125.5 kg) IBW/kg (Calculated) : 75.3 Heparin Dosing Weight: 106.7 kg  Vital Signs: Temp: 98 F (36.7 C) (01/30 0448) Temp Source: Oral (01/30 0448) BP: 109/71 (01/30 0448) Pulse Rate: 82 (01/30 0448)  Labs: Recent Labs    10/10/19 1614 10/10/19 1614 10/10/19 1638 10/10/19 1815 10/10/19 1903 10/11/19 0105 10/11/19 0105 10/11/19 0833 10/11/19 2105 10/12/19 0543  HGB 15.5   < >  --   --   --  13.9  --   --   --  12.4*  HCT 47.7  --   --   --   --  42.2  --   --   --  37.8*  PLT 247  --   --   --   --  210  --   --   --  190  APTT  --   --   --   --  28  --   --   --   --   --   LABPROT  --   --   --   --  12.8  --   --   --   --   --   INR  --   --   --   --  1.0  --   --   --   --   --   HEPARINUNFRC  --   --   --   --   --  0.43   < > 0.24* 0.28* 0.65  CREATININE 0.86  --   --   --   --  0.73  --   --   --  0.52*  TROPONINIHS  --   --  6 4  --   --   --   --   --   --    < > = values in this interval not displayed.    Estimated Creatinine Clearance: 168.9 mL/min (A) (by C-G formula based on SCr of 0.52 mg/dL (L)).   Medical History: Past Medical History:  Diagnosis Date  . Hypertension    Medications:  No PTA anticoagulation of record  Assessment: Pharmacy consulted to start heparin for PE.  CBC stable.   01/29 @ 0100 HL 0.43 therapeutic (@1650  units/hr) 01/29 @ 0833 HL 0.24 subtherapeutic (@1650  units/hr)  Goal of Therapy:  Heparin level 0.3-0.7 units/ml Monitor platelets by anticoagulation protocol: Yes   Plan:  01/30 @ 0600 HL 0.65 therapeutic. Will continue current rate and will recheck HL at 1200, CBC trended down but patient is s/p thrombectomy, will continue to monitor.  CBC trending down will continue to monitor.  2/29, PharmD,  BCPS Clinical Pharmacist 10/12/2019 7:16 AM

## 2019-10-13 NOTE — Telephone Encounter (Signed)
Ok to give one month, lisinopril 20 BID Has not been seen since 2019 Needs appt with app or myself Thx TG

## 2019-10-14 ENCOUNTER — Encounter: Payer: Self-pay | Admitting: Cardiology

## 2019-10-14 MED ORDER — LISINOPRIL 20 MG PO TABS: 20.0000 mg | ORAL_TABLET | Freq: Two times a day (BID) | ORAL | 0 refills | Status: AC

## 2019-10-14 NOTE — Telephone Encounter (Signed)
I called patient to let him know I would send in 30 day supply of his medications and that he would need appointment before we would refill again. He verbalized understanding and I told him scheduling would call to arrange appointment. He did not mention he was no longer covered to see [place]. So we will not provide any further refills.

## 2019-10-14 NOTE — Telephone Encounter (Signed)
Spoke with patient and advised that I would send in one month supply of lisinopril 20 mg twice daily and that he would need follow up appointment in order for [place] to continue sending in his medications. Advised that I would have scheduling reach out to set that up. He verbalized understanding with no further questions at this time.

## 2019-10-14 NOTE — Telephone Encounter (Signed)
Contacted patient to schedule follow up appointment, States he has [facility] plan and is not covered by [facility]. . Patient states he has to be seen in the  [facility] network for a cardiologist

## 2019-10-14 NOTE — Addendum Note (Signed)
Addended by: Bryna Colander on: 10/14/2019 12:36 PM   Modules accepted: Orders

## 2019-10-18 ENCOUNTER — Other Ambulatory Visit (INDEPENDENT_AMBULATORY_CARE_PROVIDER_SITE_OTHER): Payer: Self-pay | Admitting: Vascular Surgery

## 2019-10-18 DIAGNOSIS — Z86718 Personal history of other venous thrombosis and embolism: Secondary | ICD-10-CM

## 2019-10-22 ENCOUNTER — Ambulatory Visit (INDEPENDENT_AMBULATORY_CARE_PROVIDER_SITE_OTHER): Payer: BLUE CROSS/BLUE SHIELD | Admitting: Vascular Surgery

## 2019-10-22 ENCOUNTER — Other Ambulatory Visit: Payer: Self-pay

## 2019-10-22 ENCOUNTER — Encounter (INDEPENDENT_AMBULATORY_CARE_PROVIDER_SITE_OTHER): Payer: Self-pay | Admitting: Vascular Surgery

## 2019-10-22 ENCOUNTER — Ambulatory Visit (INDEPENDENT_AMBULATORY_CARE_PROVIDER_SITE_OTHER): Payer: BLUE CROSS/BLUE SHIELD

## 2019-10-22 ENCOUNTER — Other Ambulatory Visit (INDEPENDENT_AMBULATORY_CARE_PROVIDER_SITE_OTHER): Payer: Self-pay | Admitting: Vascular Surgery

## 2019-10-22 VITALS — BP 131/82 | HR 128 | Resp 16 | Wt 275.0 lb

## 2019-10-22 DIAGNOSIS — I10 Essential (primary) hypertension: Secondary | ICD-10-CM

## 2019-10-22 DIAGNOSIS — I82412 Acute embolism and thrombosis of left femoral vein: Secondary | ICD-10-CM

## 2019-10-22 DIAGNOSIS — I2609 Other pulmonary embolism with acute cor pulmonale: Secondary | ICD-10-CM | POA: Diagnosis not present

## 2019-10-22 DIAGNOSIS — E119 Type 2 diabetes mellitus without complications: Secondary | ICD-10-CM | POA: Diagnosis not present

## 2019-10-22 DIAGNOSIS — I82402 Acute embolism and thrombosis of unspecified deep veins of left lower extremity: Secondary | ICD-10-CM | POA: Diagnosis not present

## 2019-10-22 NOTE — Progress Notes (Signed)
MRN : [id number]  Ronald Arias is a 39 y.o. (February 21, 1981) male who presents with chief complaint of  Chief Complaint  Patient presents with  . Follow-up    ultrasound follow up   .  History of Present Illness: Patient returns today in follow up of his DVT and PE.  He is breathing much better although certainly not back to his baseline.  He underwent pulmonary thrombectomy for extensive bilateral pulmonary emboli about 2 weeks ago.  He also had left lower extremity DVT although his iliac veins were cleaned and we accessed his left femoral vein.  His left leg is doing better but is still swelling.  It is less painful.  Duplex today shows persistent DVT in the left femoral vein and up to the common femoral vein without progression from his previous study.  Current Outpatient Medications  Medication Sig Dispense Refill  . amLODipine (NORVASC) 10 MG tablet Take 1 tablet (10 mg total) by mouth daily. 90 tablet 0  . apixaban (ELIQUIS) 5 MG TABS tablet Take 2 tablets (10 mg total) by mouth 2 (two) times daily. On 10/19/19 reduce to 5 mg by mouth 2 times daily. 60 tablet 1  . blood glucose meter kit and supplies KIT Dispense based on patient and insurance preference. Use up to four times daily as directed. (FOR ICD-9 250.00, 250.01). 1 each 0  . CONTOUR NEXT TEST test strip     . insulin aspart (NOVOLOG FLEXPEN) 100 UNIT/ML FlexPen Inject 0-20 Units into the skin 3 (three) times daily with meals. Per Resistant Sliding Scale 9 mL 0  . insulin detemir (LEVEMIR) 100 unit/ml SOLN Inject 0.15 mLs (15 Units total) into the skin at bedtime. 5 mL 0  . Insulin Glargine (LANTUS) 100 UNIT/ML Solostar Pen Inject 15 Units into the skin at bedtime. 5 mL 0  . Insulin Pen Needle (NOVOFINE) 30G X 8 MM MISC Inject 10 each into the skin as needed. 2 packet 1  . Microlet Lancets MISC     . lisinopril (ZESTRIL) 20 MG tablet Take 1 tablet (20 mg total) by mouth 2 (two) times daily. (Patient not taking: Reported on  10/22/2019) 60 tablet 0  . Multiple Vitamin (MULTIVITAMIN WITH MINERALS) TABS tablet Take 1 tablet by mouth daily.     No current facility-administered medications for this visit.    Past Medical History:  Diagnosis Date  . Hypertension     Past Surgical History:  Procedure Laterality Date  . none    . PULMONARY THROMBECTOMY N/A 10/11/2019   Procedure: PULMONARY THROMBECTOMY / LYSIS with Possible Left Lower Extremity Venous Thrombectomy / Lysis possible stent placement;  Surgeon: Algernon Huxley, MD;  Location: [facility];  Service: Cardiovascular;  Laterality: N/A;     Social History   Tobacco Use  . Smoking status: Former Smoker    Packs/day: 0.50    Types: Cigarettes    Quit date: 06/07/2016    Years since quitting: 3.3  . Smokeless tobacco: Never Used  . Tobacco comment: E-cigarettes  Substance Use Topics  . Alcohol use: Not Currently    Comment: 12 pack of beer per week.   . Drug use: No    Family History  Problem Relation Age of Onset  . Cancer Mother        breast  . Diabetes Father   . Hypertension Father   . Crohn's disease Brother   . Drug abuse Brother      No Known Allergies  REVIEW OF SYSTEMS (Negative unless checked)  Constitutional: [] Weight loss  [] Fever  [] Chills Cardiac: [] Chest pain   [] Chest pressure   [] Palpitations   [] Shortness of breath when laying flat   [] Shortness of breath at rest   [x] Shortness of breath with exertion. Vascular:  [] Pain in legs with walking   [x] Pain in legs at rest   [] Pain in legs when laying flat   [] Claudication   [] Pain in feet when walking  [] Pain in feet at rest  [] Pain in feet when laying flat   [x] History of DVT   [x] Phlebitis   [x] Swelling in legs   [] Varicose veins   [] Non-healing ulcers Pulmonary:   [] Uses home oxygen   [] Productive cough   [] Hemoptysis   [] Wheeze  [] COPD   [] Asthma Neurologic:  [] Dizziness  [] Blackouts   [] Seizures   [] History of stroke   [] History of TIA  [] Aphasia   [] Temporary  blindness   [] Dysphagia   [] Weakness or numbness in arms   [] Weakness or numbness in legs Musculoskeletal:  [] Arthritis   [] Joint swelling   [] Joint pain   [] Low back pain Hematologic:  [] Easy bruising  [] Easy bleeding   [] Hypercoagulable state   [] Anemic   Gastrointestinal:  [] Blood in stool   [] Vomiting blood  [] Gastroesophageal reflux/heartburn   [] Abdominal pain Genitourinary:  [] Chronic kidney disease   [] Difficult urination  [] Frequent urination  [] Burning with urination   [] Hematuria Skin:  [] Rashes   [] Ulcers   [] Wounds Psychological:  [] History of anxiety   []  History of major depression.  Physical Examination  BP 131/82 (BP Location: Right Arm)   Pulse (!) 128   Resp 16   Wt 275 lb (124.7 kg)   BMI 38.35 kg/m  Gen:  WD/WN, NAD.  Appears older than stated age Head: [place]/AT, No temporalis wasting. Ear/Nose/Throat: Hearing grossly intact, nares w/o erythema or drainage Eyes: Conjunctiva clear. Sclera non-icteric Neck: Supple.  Trachea midline Pulmonary:  Good air movement, no use of accessory muscles.  Cardiac: RRR, no JVD Vascular:  Vessel Right Left  Radial Palpable Palpable                          PT Palpable  1+ palpable  DP Palpable  1+ palpable   Gastrointestinal: soft, non-tender/non-distended. No guarding/reflex.  Musculoskeletal: M/S 5/5 throughout.  No deformity or atrophy.  1+ left lower extremity edema. Neurologic: Sensation grossly intact in extremities.  Symmetrical.  Speech is fluent.  Psychiatric: Judgment intact, Mood & affect appropriate for pt's clinical situation. Dermatologic: No rashes or ulcers noted.  No cellulitis or open wounds.       Labs Recent Results (from the past 2160 hour(s))  CBC     Status: Abnormal   Collection Time: 10/10/19  4:14 PM  Result Value Ref Range   WBC 15.4 (H) 4.0 - 10.5 K/uL   RBC 5.66 4.22 - 5.81 MIL/uL   Hemoglobin 15.5 13.0 - 17.0 g/dL   HCT 47.7 39.0 - 52.0 %   MCV 84.3 80.0 - 100.0 fL   MCH 27.4 26.0  - 34.0 pg   MCHC 32.5 30.0 - 36.0 g/dL   RDW 13.2 11.5 - 15.5 %   Platelets 247 150 - 400 K/uL   nRBC 0.0 0.0 - 0.2 %    Comment: Performed at [facility], [address], [place], [place] [address]  Basic metabolic panel     Status: Abnormal   Collection Time: 10/10/19  4:14 PM  Result Value Ref Range   Sodium 129 (L) 135 - 145 mmol/L   Potassium 4.4 3.5 - 5.1 mmol/L   Chloride 96 (L) 98 - 111 mmol/L   CO2 20 (L) 22 - 32 mmol/L   Glucose, Bld 420 (H) 70 - 99 mg/dL   BUN 14 6 - 20 mg/dL   Creatinine, Ser 0.86 0.61 - 1.24 mg/dL   Calcium 9.4 8.9 - 10.3 mg/dL   GFR calc non Af Amer >60 >60 mL/min   GFR calc Af Amer >60 >60 mL/min   Anion gap 13 5 - 15    Comment: Performed at [facility], [address] [place], [place] [address]  Brain natriuretic peptide     Status: None   Collection Time: 10/10/19  4:14 PM  Result Value Ref Range   B Natriuretic Peptide 23.0 0.0 - 100.0 pg/mL    Comment: Performed at [facility], [place], [place] [address]  Troponin I (High Sensitivity)     Status: None   Collection Time: 10/10/19  4:38 PM  Result Value Ref Range   Troponin I (High Sensitivity) 6 <18 ng/L    Comment: (NOTE) Elevated high sensitivity troponin I (hsTnI) values and significant  changes across serial measurements may suggest ACS but many other  chronic and acute conditions are known to elevate hsTnI results.  Refer to the "Links" section for chest pain algorithms and additional  guidance. Performed at [facility], [place], [place] [address]   Troponin I (High Sensitivity)     Status: None   Collection Time: 10/10/19  6:15 PM  Result Value Ref Range   Troponin I (High Sensitivity) 4 <18 ng/L    Comment: (NOTE) Elevated high sensitivity troponin I (hsTnI) values and significant  changes across serial measurements may suggest ACS but many other  chronic and acute conditions are known to elevate hsTnI  results.  Refer to the "Links" section for chest pain algorithms and additional  guidance. Performed at [facility], [address] [place], [place] [address]   Respiratory Panel by RT PCR (Flu A&B, Covid) - Nasopharyngeal Swab     Status: None   Collection Time: 10/10/19  7:03 PM   Specimen: Nasopharyngeal Swab  Result Value Ref Range   SARS Coronavirus 2 by RT PCR NEGATIVE NEGATIVE    Comment: (NOTE) SARS-CoV-2 target nucleic acids are NOT DETECTED. The SARS-CoV-2 RNA is generally detectable in upper respiratoy specimens during the acute phase of infection. The lowest concentration of SARS-CoV-2 viral copies this assay can detect is 131 copies/mL. A negative result does not preclude SARS-Cov-2 infection and should not be used as the sole basis for treatment or other patient management decisions. A negative result may occur with  improper specimen collection/handling, submission of specimen other than nasopharyngeal swab, presence of viral mutation(s) within the areas targeted by this assay, and inadequate number of viral copies (<131 copies/mL). A negative result must be combined with clinical observations, patient history, and epidemiological information. The expected result is Negative. Fact Sheet for Patients:  PinkCheek.be Fact Sheet for Healthcare Providers:  GravelBags.it This test is not yet ap proved or cleared by the [place] FDA and  has been authorized for detection and/or diagnosis of SARS-CoV-2 by FDA under an Emergency Use Authorization (EUA). This EUA will remain  in effect (meaning this test can be used) for the duration of the COVID-19 declaration under Section 564(b)(1) of the Act, 21 U.S.C.  section 360bbb-3(b)(1), unless the authorization is terminated or revoked sooner.    Influenza A by PCR NEGATIVE NEGATIVE   Influenza B by PCR NEGATIVE NEGATIVE    Comment: (NOTE) The Xpert Xpress  SARS-CoV-2/FLU/RSV assay is intended as an aid in  the diagnosis of influenza from Nasopharyngeal swab specimens and  should not be used as a sole basis for treatment. Nasal washings and  aspirates are unacceptable for Xpert Xpress SARS-CoV-2/FLU/RSV  testing. Fact Sheet for Patients: PinkCheek.be Fact Sheet for Healthcare Providers: GravelBags.it This test is not yet approved or cleared by the [place] FDA and  has been authorized for detection and/or diagnosis of SARS-CoV-2 by  FDA under an Emergency Use Authorization (EUA). This EUA will remain  in effect (meaning this test can be used) for the duration of the  Covid-19 declaration under Section 564(b)(1) of the Act, 21  U.S.C. section 360bbb-3(b)(1), unless the authorization is  terminated or revoked. Performed at [facility], [address] [place], [place] [address]   Protime-INR     Status: None   Collection Time: 10/10/19  7:03 PM  Result Value Ref Range   Prothrombin Time 12.8 11.4 - 15.2 seconds   INR 1.0 0.8 - 1.2    Comment: (NOTE) INR goal varies based on device and disease states. Performed at [facility], [address] [place], [place] [address]   APTT     Status: None   Collection Time: 10/10/19  7:03 PM  Result Value Ref Range   aPTT 28 24 - 36 seconds    Comment: Performed at [facility], [address] [place], [place] [address]  Glucose, capillary     Status: Abnormal   Collection Time: 10/10/19 10:16 PM  Result Value Ref Range   Glucose-Capillary 315 (H) 70 - 99 mg/dL  HIV Antibody (routine testing w rflx)     Status: None   Collection Time: 10/11/19 12:04 AM  Result Value Ref Range   HIV Screen 4th Generation wRfx NON REACTIVE NON REACTIVE    Comment: Performed at [facility], [place] [address], [place], [place] [address]  Hemoglobin A1c     Status: Abnormal   Collection Time: 10/11/19 12:04 AM    Result Value Ref Range   Hgb A1c MFr Bld 13.6 (H) 4.8 - 5.6 %    Comment: (NOTE) Pre diabetes:          5.7%-6.4% Diabetes:              >6.4% Glycemic control for   <7.0% adults with diabetes    Mean Plasma Glucose 343.62 mg/dL    Comment: Performed at [facility] [address], [place], [place] [address]  Glucose, capillary     Status: Abnormal   Collection Time: 10/11/19 12:16 AM  Result Value Ref Range   Glucose-Capillary 339 (H) 70 - 99 mg/dL  Heparin level (unfractionated)     Status: None   Collection Time: 10/11/19  1:05 AM  Result Value Ref Range   Heparin Unfractionated 0.43 0.30 - 0.70 IU/mL    Comment: (NOTE) If heparin results are below expected values, and patient dosage has  been confirmed, suggest follow up testing of antithrombin III levels. Performed at [facility], [address] [place], [place] [address]   CBC     Status: Abnormal   Collection Time: 10/11/19  1:05 AM  Result Value Ref Range   WBC 12.8 (H) 4.0 - 10.5 K/uL  RBC 5.05 4.22 - 5.81 MIL/uL   Hemoglobin 13.9 13.0 - 17.0 g/dL   HCT 42.2 39.0 - 52.0 %   MCV 83.6 80.0 - 100.0 fL   MCH 27.5 26.0 - 34.0 pg   MCHC 32.9 30.0 - 36.0 g/dL   RDW 13.2 11.5 - 15.5 %   Platelets 210 150 - 400 K/uL   nRBC 0.0 0.0 - 0.2 %    Comment: Performed at [facility], [address] [place], [place] [address]  Basic metabolic panel     Status: Abnormal   Collection Time: 10/11/19  1:05 AM  Result Value Ref Range   Sodium 132 (L) 135 - 145 mmol/L   Potassium 4.0 3.5 - 5.1 mmol/L   Chloride 99 98 - 111 mmol/L   CO2 24 22 - 32 mmol/L   Glucose, Bld 328 (H) 70 - 99 mg/dL   BUN 13 6 - 20 mg/dL   Creatinine, Ser 0.73 0.61 - 1.24 mg/dL   Calcium 9.0 8.9 - 10.3 mg/dL   GFR calc non Af Amer >60 >60 mL/min   GFR calc Af Amer >60 >60 mL/min   Anion gap 9 5 - 15    Comment: Performed at [facility], [address] [place], [place] [address]  Glucose, capillary     Status:  Abnormal   Collection Time: 10/11/19  4:32 AM  Result Value Ref Range   Glucose-Capillary 213 (H) 70 - 99 mg/dL  Heparin level (unfractionated)     Status: Abnormal   Collection Time: 10/11/19  8:33 AM  Result Value Ref Range   Heparin Unfractionated 0.24 (L) 0.30 - 0.70 IU/mL    Comment: (NOTE) If heparin results are below expected values, and patient dosage has  been confirmed, suggest follow up testing of antithrombin III levels. Performed at [facility], [address] [place], [place] [address]   Glucose, capillary     Status: Abnormal   Collection Time: 10/11/19  8:36 AM  Result Value Ref Range   Glucose-Capillary 207 (H) 70 - 99 mg/dL  ECHOCARDIOGRAM COMPLETE     Status: None   Collection Time: 10/11/19  9:40 AM  Result Value Ref Range   Weight 4,800 oz   Height 71 in   BP 123/80 mmHg  Glucose, capillary     Status: Abnormal   Collection Time: 10/11/19  4:28 PM  Result Value Ref Range   Glucose-Capillary 273 (H) 70 - 99 mg/dL  Glucose, capillary     Status: Abnormal   Collection Time: 10/11/19  7:38 PM  Result Value Ref Range   Glucose-Capillary 402 (H) 70 - 99 mg/dL  Heparin level (unfractionated)     Status: Abnormal   Collection Time: 10/11/19  9:05 PM  Result Value Ref Range   Heparin Unfractionated 0.28 (L) 0.30 - 0.70 IU/mL    Comment: (NOTE) If heparin results are below expected values, and patient dosage has  been confirmed, suggest follow up testing of antithrombin III levels. Performed at [facility], [address] [place], [place] [address]   Glucose, capillary     Status: Abnormal   Collection Time: 10/11/19 10:12 PM  Result Value Ref Range   Glucose-Capillary 302 (H) 70 - 99 mg/dL   Comment 1 Notify RN   Glucose, capillary     Status: Abnormal   Collection Time: 10/11/19 11:51 PM  Result Value Ref Range   Glucose-Capillary 408 (H) 70 - 99 mg/dL  Glucose, capillary  Status: Abnormal   Collection Time: 10/12/19  4:16 AM   Result Value Ref Range   Glucose-Capillary 183 (H) 70 - 99 mg/dL  Glucose, capillary     Status: Abnormal   Collection Time: 10/12/19  4:47 AM  Result Value Ref Range   Glucose-Capillary 156 (H) 70 - 99 mg/dL  Basic metabolic panel     Status: Abnormal   Collection Time: 10/12/19  5:43 AM  Result Value Ref Range   Sodium 137 135 - 145 mmol/L   Potassium 4.0 3.5 - 5.1 mmol/L   Chloride 103 98 - 111 mmol/L   CO2 25 22 - 32 mmol/L   Glucose, Bld 170 (H) 70 - 99 mg/dL   BUN 11 6 - 20 mg/dL   Creatinine, Ser 0.52 (L) 0.61 - 1.24 mg/dL   Calcium 8.5 (L) 8.9 - 10.3 mg/dL   GFR calc non Af Amer >60 >60 mL/min   GFR calc Af Amer >60 >60 mL/min   Anion gap 9 5 - 15    Comment: Performed at [facility], [address] [place], [place] [address]  CBC     Status: Abnormal   Collection Time: 10/12/19  5:43 AM  Result Value Ref Range   WBC 10.6 (H) 4.0 - 10.5 K/uL   RBC 4.51 4.22 - 5.81 MIL/uL   Hemoglobin 12.4 (L) 13.0 - 17.0 g/dL   HCT 37.8 (L) 39.0 - 52.0 %   MCV 83.8 80.0 - 100.0 fL   MCH 27.5 26.0 - 34.0 pg   MCHC 32.8 30.0 - 36.0 g/dL   RDW 13.5 11.5 - 15.5 %   Platelets 190 150 - 400 K/uL   nRBC 0.0 0.0 - 0.2 %    Comment: Performed at [facility], [address] [place], [place] [address]  Heparin level (unfractionated)     Status: None   Collection Time: 10/12/19  5:43 AM  Result Value Ref Range   Heparin Unfractionated 0.65 0.30 - 0.70 IU/mL    Comment: (NOTE) If heparin results are below expected values, and patient dosage has  been confirmed, suggest follow up testing of antithrombin III levels. Performed at [facility], [address] [place], [place] [address]   Glucose, capillary     Status: Abnormal   Collection Time: 10/12/19  7:43 AM  Result Value Ref Range   Glucose-Capillary 168 (H) 70 - 99 mg/dL   Comment 1 Notify RN   Glucose, capillary     Status: Abnormal   Collection Time: 10/12/19 11:46 AM  Result Value Ref Range    Glucose-Capillary 330 (H) 70 - 99 mg/dL    Radiology CT Angio Chest PE W and/or Wo Contrast  Result Date: 10/10/2019 CLINICAL DATA:  Shortness of breath EXAM: CT ANGIOGRAPHY CHEST WITH CONTRAST TECHNIQUE: Multidetector CT imaging of the chest was performed using the standard protocol during bolus administration of intravenous contrast. Multiplanar CT image reconstructions and MIPs were obtained to evaluate the vascular anatomy. CONTRAST:  139m OMNIPAQUE IOHEXOL 350 MG/ML SOLN COMPARISON:  Chest radiograph dated 09/09/2016. FINDINGS: Cardiovascular: Satisfactory opacification of the pulmonary arteries to the segmental level. Pulmonary emboli are seen involving the left pulmonary artery and the lobar, segmental, and subsegmental branches supplying the left lung as well as involving subsegmental branches supplying the right lower lobe. The internal diameter of the right ventricle is 4.4 cm compared to 4.8 of the left ventricle in the axial plane (RV to LV ratio equals 0.9), suggestive of right heart  strain. The heart is mildly enlarged. No pericardial effusion. Mediastinum/Nodes: No enlarged mediastinal, hilar, or axillary lymph nodes. Thyroid gland, trachea, and esophagus demonstrate no significant findings. Lungs/Pleura: Paraseptal emphysema is noted. There is no focal consolidation or pneumothorax. Atelectasis/scarring is seen in the posterior right lung and in the left lower lobe. There is pleural thickening with calcification involving the right lung. A loculated pleural effusion versus pleural thickening is seen in the lateral left lung. Upper Abdomen: There is hepatic steatosis. Musculoskeletal: No chest wall abnormality. No acute or significant osseous findings. Review of the MIP images confirms the above findings. IMPRESSION: 1. Pulmonary emboli involving the left pulmonary artery and the lobar, segmental, and subsegmental branches supplying the left lung as well as involving subsegmental branches  supplying the right lower lobe. There is an RV to LV ratio of 0.9 which is suggestive of right heart strain. This is consistent with at least submassive (intermediate risk) PE. The presence of right heart strain has been associated with an increased risk of morbidity and mortality. Please activate Code PE by paging [phone]. 2. Calcified pleural thickening with associated atelectasis/scarring involving the right lung which may reflect prior asbestos exposure. 3. Pleural thickening versus a small loculated pleural effusion involving the left lung. 4. Hepatic steatosis. Emphysema (ICD10-J43.9). These results were called by telephone at the time of interpretation on 10/10/2019 at 7:00 pm to provider Dr. Joan Mayans, who verbally acknowledged these results. Electronically Signed   By: Zerita Boers M.D.   On: 10/10/2019 19:08   PERIPHERAL VASCULAR CATHETERIZATION  Result Date: 10/11/2019 See op note  US Venous Img Lower Unilateral Left (DVT)  Result Date: 10/10/2019 CLINICAL DATA:  Left leg pain, swelling EXAM: LEFT LOWER EXTREMITY VENOUS DOPPLER ULTRASOUND TECHNIQUE: Gray-scale sonography with graded compression, as well as color Doppler and duplex ultrasound were performed to evaluate the lower extremity deep venous systems from the level of the common femoral vein and including the common femoral, femoral, profunda femoral, popliteal and calf veins including the posterior tibial, peroneal and gastrocnemius veins when visible. The superficial great saphenous vein was also interrogated. Spectral Doppler was utilized to evaluate flow at rest and with distal augmentation maneuvers in the common femoral, femoral and popliteal veins. COMPARISON:  None. FINDINGS: Contralateral Common Femoral Vein: Respiratory phasicity is normal and symmetric with the symptomatic side. No evidence of thrombus. Normal compressibility. Common Femoral Vein: No evidence of thrombus. Normal compressibility, respiratory phasicity and  response to augmentation. Saphenofemoral Junction: No evidence of thrombus. Normal compressibility and flow on color Doppler imaging. Profunda Femoral Vein: No evidence of thrombus. Normal compressibility and flow on color Doppler imaging. Femoral Vein: There is occlusive thrombus noted throughout the left femoral vein. No flow visualized. Vessel is noncompressible. Popliteal Vein: Occlusive thrombus noted throughout the left popliteal vein which is nonocclusive. No flow visualized. Calf Veins: Occlusive thrombus continues into the posterior tibial veins of the left calf. Superficial Great Saphenous Vein: No evidence of thrombus. Normal compressibility. Venous Reflux:  None. Other Findings:  None. IMPRESSION: Acute DVT seen within the left femoral vein, popliteal vein and posterior tibial vein in the left calf. Electronically Signed   By: Rolm Baptise M.D.   On: 10/10/2019 19:38   ECHOCARDIOGRAM COMPLETE  Result Date: 10/11/2019   ECHOCARDIOGRAM REPORT   Patient Name:   Ronald Arias Date of Exam: 10/11/2019 Medical Rec #:  [id number]          Height:       71.0 in Accession #:  [id number]         Weight:       300.0 lb Date of Birth:  04/13/1981          BSA:          2.51 m Patient Age:    40 years           BP:           116/70 mmHg Patient Gender: M                  HR:           73 bpm. Exam Location:  [facility] Procedure: 2D Echo, Cardiac Doppler, Color Doppler and Intracardiac            Opacification Agent Indications:     I26.99 Pulmonary Embolus  History:         Patient has prior history of Echocardiogram examinations. Risk                  Factors:Hypertension.  Sonographer:     Charmayne Sheer RDCS (AE) Referring Phys:  [id number] [place] Diagnosing Phys: Neoma Laming MD  Sonographer Comments: Technically difficult study due to poor echo windows. Image acquisition challenging due to patient body habitus. IMPRESSIONS  1. Left ventricular ejection fraction, by visual estimation, is 60 to 65%. The left  ventricle has normal function. There is no left ventricular hypertrophy.  2. Definity contrast agent was given IV to delineate the left ventricular endocardial borders.  3. The left ventricle has no regional wall motion abnormalities.  4. Global right ventricle has normal systolic function.The right ventricular size is normal. No increase in right ventricular wall thickness.  5. Left atrial size was normal.  6. Right atrial size was normal.  7. The mitral valve is normal in structure. No evidence of mitral valve regurgitation. No evidence of mitral stenosis.  8. The tricuspid valve is normal in structure.  9. The tricuspid valve is normal in structure. Tricuspid valve regurgitation is not demonstrated. 10. The aortic valve is normal in structure. Aortic valve regurgitation is not visualized. No evidence of aortic valve sclerosis or stenosis. 11. The pulmonic valve was normal in structure. Pulmonic valve regurgitation is not visualized. 12. The inferior vena cava is normal in size with greater than 50% respiratory variability, suggesting right atrial pressure of 3 mmHg. FINDINGS  Left Ventricle: Left ventricular ejection fraction, by visual estimation, is 60 to 65%. The left ventricle has normal function. Definity contrast agent was given IV to delineate the left ventricular endocardial borders. The left ventricle has no regional wall motion abnormalities. There is no left ventricular hypertrophy. Normal left atrial pressure. Right Ventricle: The right ventricular size is normal. No increase in right ventricular wall thickness. Global RV systolic function is has normal systolic function. Left Atrium: Left atrial size was normal in size. Right Atrium: Right atrial size was normal in size Pericardium: There is no evidence of pericardial effusion. Mitral Valve: The mitral valve is normal in structure. No evidence of mitral valve regurgitation. No evidence of mitral valve stenosis by observation. MV peak gradient, 5.6  mmHg. Tricuspid Valve: The tricuspid valve is normal in structure. Tricuspid valve regurgitation is not demonstrated. Aortic Valve: The aortic valve is normal in structure. Aortic valve regurgitation is not visualized. The aortic valve is structurally normal, with no evidence of sclerosis or stenosis. Aortic valve mean gradient measures 5.0 mmHg. Aortic valve peak gradient measures 10.4 mmHg. Aortic valve area,  by VTI measures 3.12 cm. Pulmonic Valve: The pulmonic valve was normal in structure. Pulmonic valve regurgitation is not visualized. Pulmonic regurgitation is not visualized. Aorta: The aortic root, ascending aorta and aortic arch are all structurally normal, with no evidence of dilitation or obstruction. Venous: The inferior vena cava is normal in size with greater than 50% respiratory variability, suggesting right atrial pressure of 3 mmHg. IAS/Shunts: No atrial level shunt detected by color flow Doppler. There is no evidence of a patent foramen ovale. No ventricular septal defect is seen or detected. There is no evidence of an atrial septal defect.  LEFT VENTRICLE PLAX 2D LVIDd:         5.49 cm  Diastology LVIDs:         4.28 cm  LV e' lateral:   15.20 cm/s LV PW:         0.81 cm  LV E/e' lateral: 6.7 LV IVS:        0.62 cm  LV e' medial:    11.20 cm/s LVOT diam:     2.20 cm  LV E/e' medial:  9.1 LV SV:         65 ml LV SV Index:   24.20 LVOT Area:     3.80 cm  RIGHT VENTRICLE RV Basal diam:  4.01 cm LEFT ATRIUM           Index       RIGHT ATRIUM           Index LA diam:      3.80 cm 1.52 cm/m  RA Area:     14.80 cm LA Vol (A4C): 30.3 ml 12.10 ml/m RA Volume:   37.30 ml  14.89 ml/m  AORTIC VALVE                    PULMONIC VALVE AV Area (Vmax):    2.79 cm     PV Vmax:       1.05 m/s AV Area (Vmean):   2.84 cm     PV Vmean:      68.500 cm/s AV Area (VTI):     3.12 cm     PV VTI:        0.209 m AV Vmax:           161.00 cm/s  PV Peak grad:  4.4 mmHg AV Vmean:          110.000 cm/s PV Mean grad:  2.0  mmHg AV VTI:            0.274 m AV Peak Grad:      10.4 mmHg AV Mean Grad:      5.0 mmHg LVOT Vmax:         118.00 cm/s LVOT Vmean:        82.200 cm/s LVOT VTI:          0.225 m LVOT/AV VTI ratio: 0.82  AORTA Ao Root diam: 3.10 cm MITRAL VALVE MV Area (PHT): 3.53 cm              SHUNTS MV Peak grad:  5.6 mmHg              Systemic VTI:  0.22 m MV Mean grad:  3.0 mmHg              Systemic Diam: 2.20 cm MV Vmax:       1.18 m/s MV Vmean:      81.6 cm/s MV VTI:  0.27 m MV PHT:        62.35 msec MV Decel Time: 215 msec MV E velocity: 102.00 cm/s 103 cm/s MV A velocity: 92.60 cm/s  70.3 cm/s MV E/A ratio:  1.10        1.5  Neoma Laming MD Electronically signed by Neoma Laming MD Signature Date/Time: 10/11/2019/4:15:20 PM    Final     Assessment/Plan  Type 2 diabetes mellitus ([place]) blood glucose control important in reducing the progression of atherosclerotic disease. Also, involved in wound healing. On appropriate medications.   HTN (hypertension) blood pressure control important in reducing the progression of atherosclerotic disease. On appropriate oral medications.   Acute pulmonary embolism ([place]) Patient is about 2 weeks out from a procedure for his extensive bilateral PE.  This is certainly improved his symptoms but has not resolved it.  I suspect this will take several more weeks of getting back to reasonably full strength.  I have asked him to increase his activity and exercise as tolerated.  He will continue anticoagulants for a minimum of 6 months.  DVT of lower extremity (deep venous thrombosis) ([facility]) Duplex today shows persistent DVT in the left femoral vein and up to the common femoral vein without progression from his previous study.  No iliac components will not plan any thrombectomy or intervention to this DVT.  Would plan continued anticoagulation for a minimum of 6 months.  Prescription given for 20 to 30 mmHg compression stockings today to help control swelling.  Return to clinic  in 3 months.    Leotis Pain, MD  10/22/2019 5:34 PM    This note was created with Dragon medical transcription system.  Any errors from dictation are purely unintentional

## 2019-10-22 NOTE — Assessment & Plan Note (Signed)
Duplex today shows persistent DVT in the left femoral vein and up to the common femoral vein without progression from his previous study.  No iliac components will not plan any thrombectomy or intervention to this DVT.  Would plan continued anticoagulation for a minimum of 6 months.  Prescription given for 20 to 30 mmHg compression stockings today to help control swelling.  Return to clinic in 3 months.

## 2019-10-22 NOTE — Assessment & Plan Note (Signed)
blood pressure control important in reducing the progression of atherosclerotic disease. On appropriate oral medications.  

## 2019-10-22 NOTE — Patient Instructions (Signed)

## 2019-10-22 NOTE — Assessment & Plan Note (Signed)
Patient is about 2 weeks out from a procedure for his extensive bilateral PE.  This is certainly improved his symptoms but has not resolved it.  I suspect this will take several more weeks of getting back to reasonably full strength.  I have asked him to increase his activity and exercise as tolerated.  He will continue anticoagulants for a minimum of 6 months.

## 2019-10-22 NOTE — Assessment & Plan Note (Signed)
blood glucose control important in reducing the progression of atherosclerotic disease. Also, involved in wound healing. On appropriate medications.  

## 2019-12-23 ENCOUNTER — Ambulatory Visit: Payer: BLUE CROSS/BLUE SHIELD

## 2020-01-02 ENCOUNTER — Other Ambulatory Visit: Payer: Self-pay | Admitting: Cardiovascular Disease

## 2020-01-02 NOTE — Telephone Encounter (Signed)
Please schedule office visit for refills. Thank you! 

## 2020-01-03 NOTE — Telephone Encounter (Signed)
Will be moving to [facility] system due to insurance

## 2020-01-03 NOTE — Telephone Encounter (Signed)
OK to refill? Last seen 07/30/2018. Will no longer be coming to our practice due to insurance changes. Please advise. Thank you!

## 2020-01-21 ENCOUNTER — Ambulatory Visit (INDEPENDENT_AMBULATORY_CARE_PROVIDER_SITE_OTHER): Payer: BLUE CROSS/BLUE SHIELD | Admitting: Vascular Surgery

## 2021-10-09 IMAGING — US US EXTREM LOW VENOUS*L*
1 series · 13 of 24 positions shown · non-contrast
Comparison: None.

CLINICAL DATA: Left leg pain, swelling



[Series 1: us extrem low venous*left* · 13 of 33 slices shown]
[im 1/33]
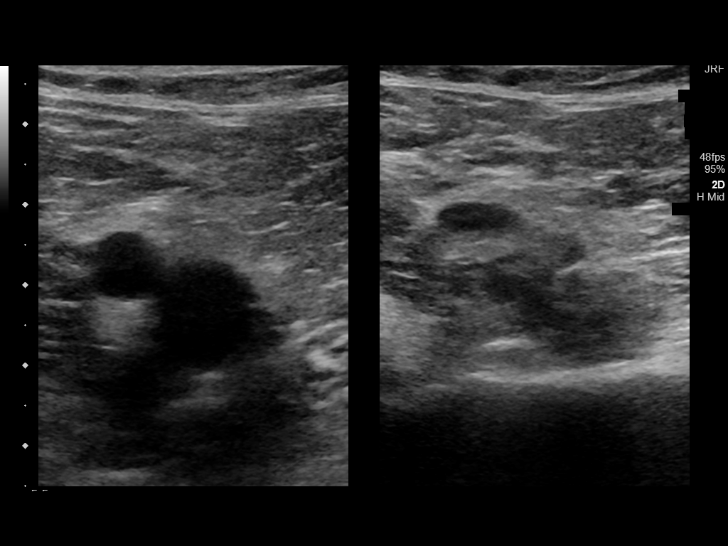
[im 3/33]
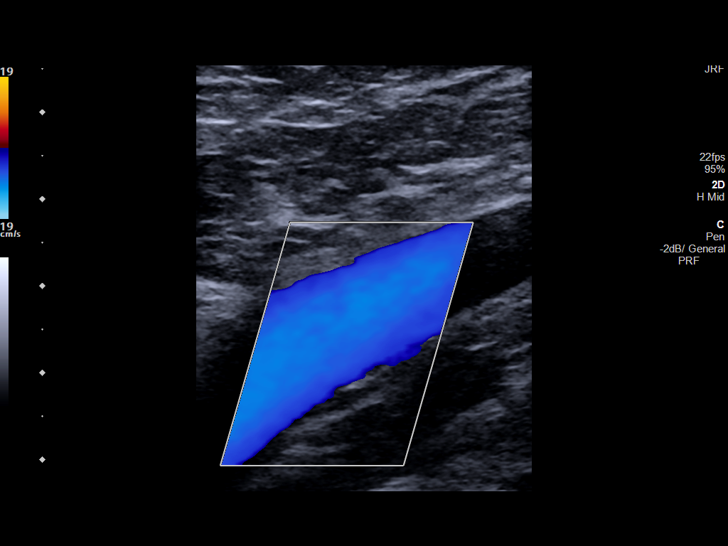
[im 6/33]
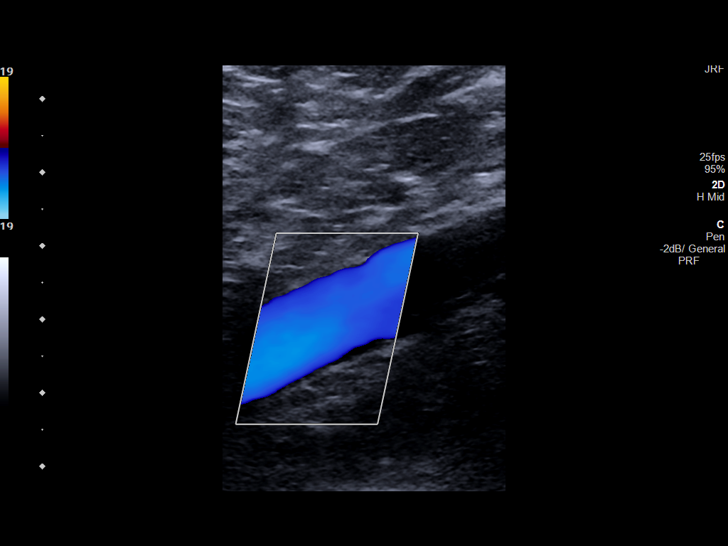
[im 9/33]
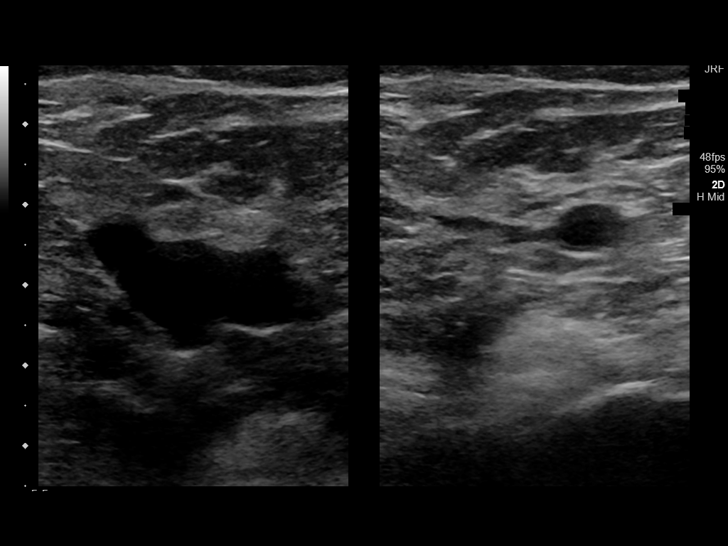
[im 12/33]
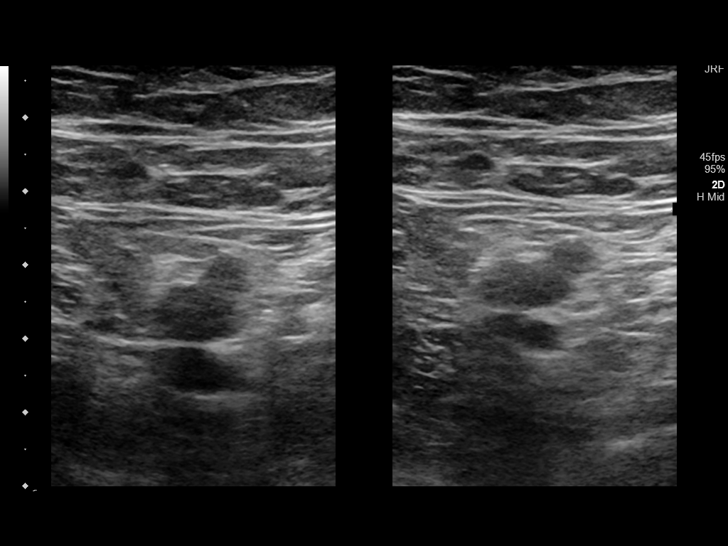
[im 14/33]
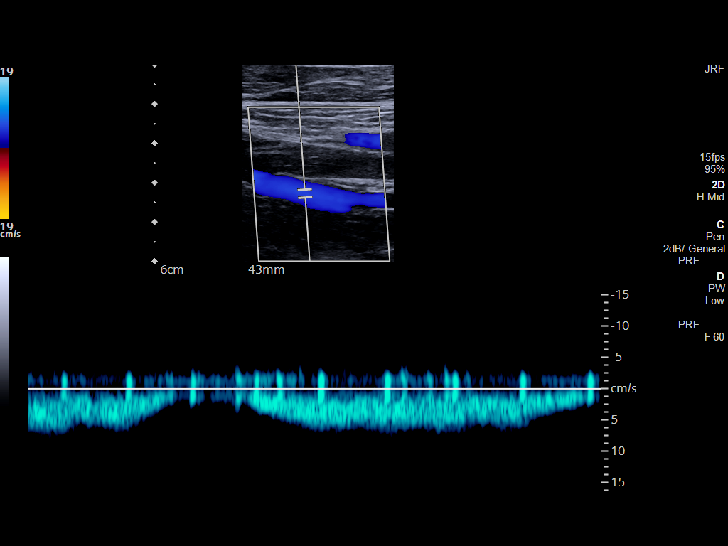
[im 17/33]
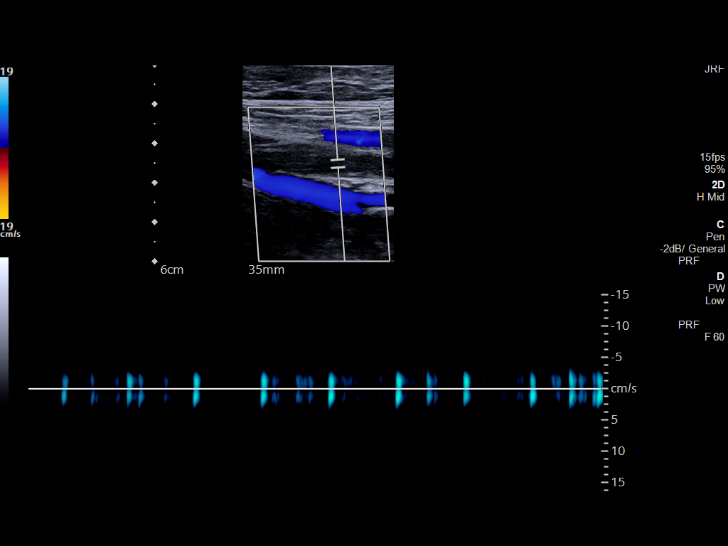
[im 19/33]
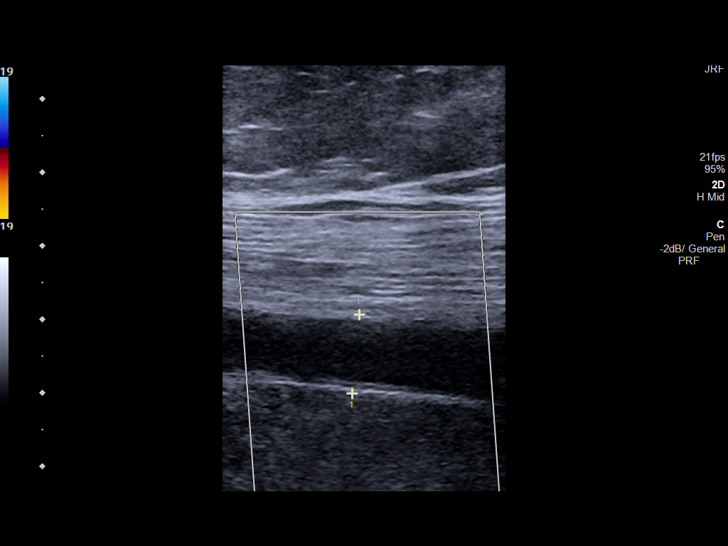
[im 21/33]
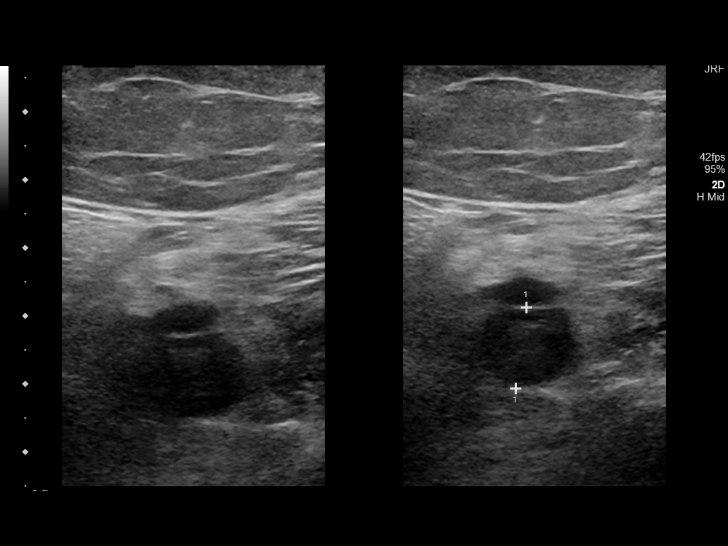
[im 24/33]
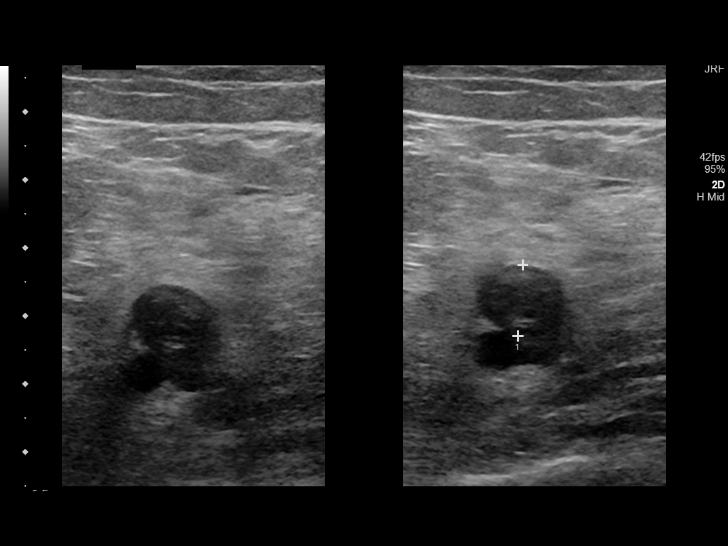
[im 27/33]
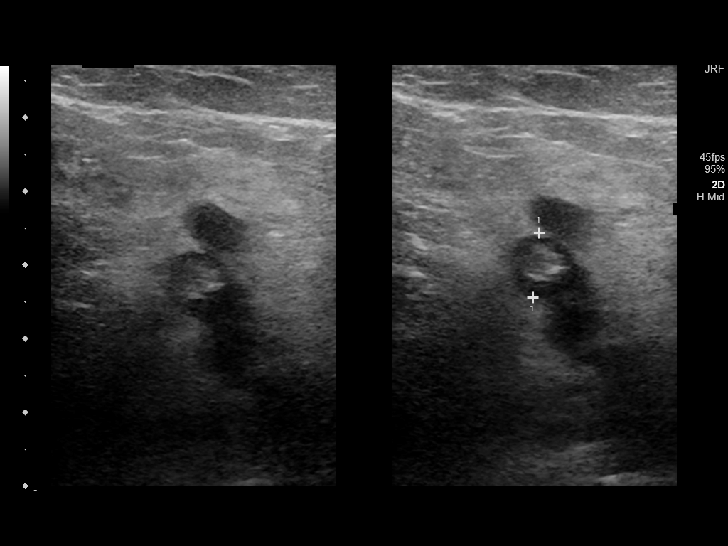
[im 30/33]
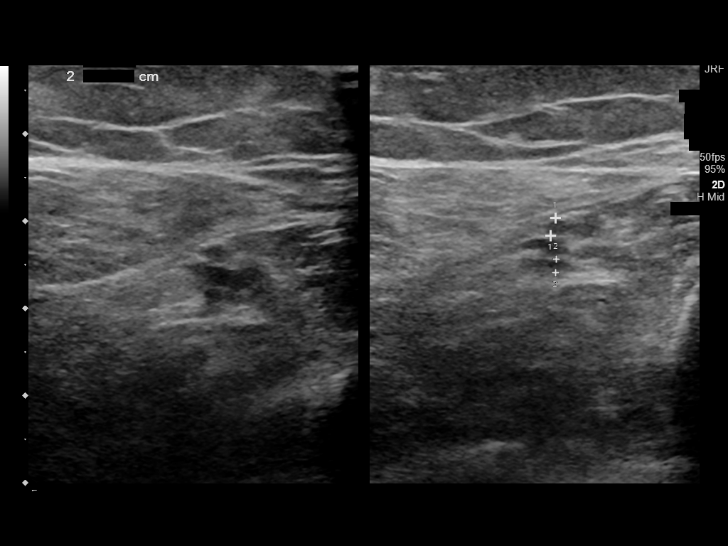
[im 33/33]
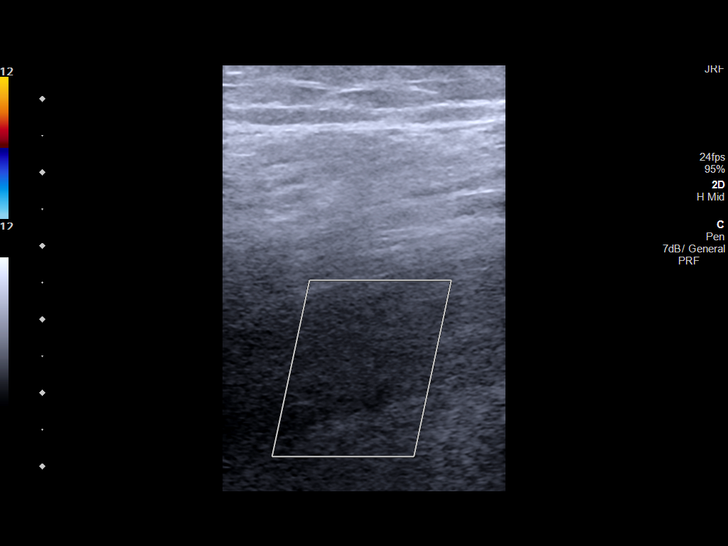

[13 of 24 positions shown; findings below may reference images not displayed]

FINDINGS: Contralateral Common Femoral Vein: Respiratory phasicity is normal
and symmetric with the symptomatic side. No evidence of thrombus.
Normal compressibility.

Common Femoral Vein: No evidence of thrombus. Normal
compressibility, respiratory phasicity and response to augmentation.

Saphenofemoral Junction: No evidence of thrombus. Normal
compressibility and flow on color Doppler imaging.

Profunda Femoral Vein: No evidence of thrombus. Normal
compressibility and flow on color Doppler imaging.

Femoral Vein: There is occlusive thrombus noted throughout the left
femoral vein. No flow visualized. Vessel is noncompressible.

Popliteal Vein: Occlusive thrombus noted throughout the left
popliteal vein which is nonocclusive. No flow visualized.

Calf Veins: Occlusive thrombus continues into the posterior tibial
veins of the left calf.

Superficial Great Saphenous Vein: No evidence of thrombus. Normal
compressibility.

Venous Reflux:  None.

Other Findings:  None.
IMPRESSION: Acute DVT seen within the left femoral vein, popliteal vein and
posterior tibial vein in the left calf.

## 2024-05-31 DIAGNOSIS — J929 Pleural plaque without asbestos: Secondary | ICD-10-CM | POA: Diagnosis not present

## 2024-05-31 DIAGNOSIS — Z794 Long term (current) use of insulin: Secondary | ICD-10-CM | POA: Diagnosis not present

## 2024-05-31 DIAGNOSIS — I10 Essential (primary) hypertension: Secondary | ICD-10-CM | POA: Diagnosis not present

## 2024-05-31 DIAGNOSIS — R0789 Other chest pain: Secondary | ICD-10-CM | POA: Diagnosis not present

## 2024-05-31 DIAGNOSIS — G8929 Other chronic pain: Secondary | ICD-10-CM | POA: Diagnosis not present

## 2024-05-31 DIAGNOSIS — J984 Other disorders of lung: Secondary | ICD-10-CM | POA: Diagnosis not present

## 2024-05-31 DIAGNOSIS — I272 Pulmonary hypertension, unspecified: Secondary | ICD-10-CM | POA: Diagnosis not present

## 2024-05-31 DIAGNOSIS — Z87891 Personal history of nicotine dependence: Secondary | ICD-10-CM | POA: Diagnosis not present

## 2024-05-31 DIAGNOSIS — Z79899 Other long term (current) drug therapy: Secondary | ICD-10-CM | POA: Diagnosis not present

## 2024-05-31 DIAGNOSIS — R531 Weakness: Secondary | ICD-10-CM | POA: Diagnosis not present

## 2024-05-31 DIAGNOSIS — Z760 Encounter for issue of repeat prescription: Secondary | ICD-10-CM | POA: Diagnosis not present

## 2024-05-31 DIAGNOSIS — E1165 Type 2 diabetes mellitus with hyperglycemia: Secondary | ICD-10-CM | POA: Diagnosis not present

## 2024-05-31 DIAGNOSIS — R079 Chest pain, unspecified: Secondary | ICD-10-CM | POA: Diagnosis not present

## 2024-06-01 DIAGNOSIS — J929 Pleural plaque without asbestos: Secondary | ICD-10-CM | POA: Diagnosis not present

## 2024-06-01 DIAGNOSIS — I272 Pulmonary hypertension, unspecified: Secondary | ICD-10-CM | POA: Diagnosis not present

## 2024-06-01 DIAGNOSIS — J984 Other disorders of lung: Secondary | ICD-10-CM | POA: Diagnosis not present

## 2024-06-01 DIAGNOSIS — R531 Weakness: Secondary | ICD-10-CM | POA: Diagnosis not present
